# Patient Record
Sex: Male | Born: 1970 | Race: White | Hispanic: No | Marital: Married | Smoking: Former smoker
Health system: Southern US, Community
[De-identification: ages and names within clinical notes are randomized; demographics above are authoritative.]

## PROBLEM LIST (undated history)

## (undated) DIAGNOSIS — I2510 Atherosclerotic heart disease of native coronary artery without angina pectoris: Secondary | ICD-10-CM

## (undated) DIAGNOSIS — Z9889 Other specified postprocedural states: Secondary | ICD-10-CM

## (undated) DIAGNOSIS — M109 Gout, unspecified: Secondary | ICD-10-CM

## (undated) DIAGNOSIS — I10 Essential (primary) hypertension: Secondary | ICD-10-CM

## (undated) HISTORY — PX: SHOULDER SURGERY: SHX246

## (undated) HISTORY — DX: Gout, unspecified: M10.9

## (undated) HISTORY — PX: NECK MASS EXCISION: SHX2079

## (undated) HISTORY — DX: Essential (primary) hypertension: I10

## (undated) HISTORY — PX: SHOULDER ARTHROSCOPY: SHX128

## (undated) HISTORY — PX: APPENDECTOMY: SHX54

## (undated) HISTORY — DX: Other specified postprocedural states: Z98.890

## (undated) HISTORY — DX: Atherosclerotic heart disease of native coronary artery without angina pectoris: I25.10

## (undated) HISTORY — PX: MANDIBLE FRACTURE SURGERY: SHX706

---

## 2000-05-18 DIAGNOSIS — Z9889 Other specified postprocedural states: Secondary | ICD-10-CM

## 2000-05-18 HISTORY — PX: KNEE SURGERY: SHX244

## 2000-05-18 HISTORY — DX: Other specified postprocedural states: Z98.890

## 2005-03-08 ENCOUNTER — Emergency Department (HOSPITAL_COMMUNITY): Admission: EM | Admit: 2005-03-08 | Discharge: 2005-03-09 | Payer: Self-pay | Admitting: Emergency Medicine

## 2008-03-08 ENCOUNTER — Ambulatory Visit: Payer: Self-pay | Admitting: Sports Medicine

## 2008-03-08 DIAGNOSIS — M25569 Pain in unspecified knee: Secondary | ICD-10-CM

## 2010-02-20 ENCOUNTER — Encounter: Payer: Self-pay | Admitting: Internal Medicine

## 2010-02-20 ENCOUNTER — Ambulatory Visit: Payer: Self-pay

## 2010-02-20 DIAGNOSIS — R002 Palpitations: Secondary | ICD-10-CM | POA: Insufficient documentation

## 2010-06-17 NOTE — Miscellaneous (Signed)
Summary: Orders Update  Clinical Lists Changes  Problems: Added new problem of PALPITATIONS (ICD-785.1) Orders: Added new Referral order of Holter Monitor (Holter Monitor) - Signed

## 2010-06-17 NOTE — Procedures (Signed)
Summary: Summary Report  Summary Report   Imported By: Erle Crocker 04/03/2010 13:47:12  _____________________________________________________________________  External Attachment:    Type:   Image     Comment:   External Document

## 2010-11-26 ENCOUNTER — Encounter: Payer: Self-pay | Admitting: Internal Medicine

## 2011-01-05 ENCOUNTER — Encounter: Payer: Self-pay | Admitting: Family Medicine

## 2011-01-05 ENCOUNTER — Ambulatory Visit (INDEPENDENT_AMBULATORY_CARE_PROVIDER_SITE_OTHER): Payer: 59 | Admitting: Family Medicine

## 2011-01-05 VITALS — BP 148/83 | Ht 71.5 in | Wt 180.0 lb

## 2011-01-05 DIAGNOSIS — M25511 Pain in right shoulder: Secondary | ICD-10-CM

## 2011-01-05 DIAGNOSIS — M25519 Pain in unspecified shoulder: Secondary | ICD-10-CM

## 2011-01-05 MED ORDER — MELOXICAM 15 MG PO TABS: 15.0000 mg | ORAL_TABLET | Freq: Every day | ORAL | Status: DC

## 2011-01-05 NOTE — Progress Notes (Signed)
  Subjective:    Patient ID: Brendan Turner, male    DOB: [birth year], 40 y.o.   MRN: [id number]  HPI 40 yo M here for right shoulder pain.  Patient reports no known injury. Has been working out with P90x including doing shoulder DVD. Started noticing gradual pain lateral right shoulder worse with reaching, overhead activities, forward raises with weights. Sleeping on right side worsens also. Pain seems better toward end of day. Not waking him up at night. Not currently taking any pain medicines. Right handed.  Past Medical History  Diagnosis Date  . S/P arthroscopy of left knee 2002    No current outpatient prescriptions on file prior to visit.    Past Surgical History  Procedure Date  . Knee surgery 2002    left knee arthroscopy    No Known Allergies  History   Social History  . Marital Status: Single    Spouse Name: N/A    Number of Children: N/A  . Years of Education: N/A   Occupational History  . Not on file.   Social History Main Topics  . Smoking status: Never Smoker   . Smokeless tobacco: Not on file  . Alcohol Use: Not on file  . Drug Use: Not on file  . Sexually Active: Not on file   Other Topics Concern  . Not on file   Social History Narrative   Neighbors with vince howard and friends with Delene Ruffini who suggested he see me does cycling and some running with them.     No family history on file.  BP 148/83  Ht 5' 11.5" (1.816 m)  Wt 180 lb (81.647 kg)  BMI 24.75 kg/m2  Review of Systems See HPI above.    Objective:   Physical Exam Gen: NAD R shoulder: No swelling, ecchymoses.  No gross deformity. No TTP biceps tendon, AC. FROM with mild painful arc. Positive neers, empty can.  Negative hawkins. Negative Speeds, Yergasons. Negative crossover adduction. 5/5 strength with empty can and resisted internal/external rotation. Negative apprehension. NV intact distally.    Assessment & Plan:  1. Right shoulder rotator cuff  impingement - strength normal and no acute injury to suggest rotator cuff tear.  Start with mobic + home rotator cuff exercises, avoid painful activities.  If not improving as expected, consider PT, ultrasound, and/or subacromial injection.  Icing prn.  See instructions for further.

## 2011-01-05 NOTE — Assessment & Plan Note (Signed)
Right shoulder rotator cuff impingement - strength normal and no acute injury to suggest rotator cuff tear.  Start with mobic + home rotator cuff exercises, avoid painful activities.  If not improving as expected, consider PT, ultrasound, and/or subacromial injection.  Icing prn.  See instructions for further.

## 2011-01-05 NOTE — Patient Instructions (Signed)
You have rotator cuff impingement (subacromial bursitis, rotator cuff tendinitis). Try to avoid painful activities (overhead activities, lifting with extended arm) as much as possible. Meloxicam 15mg  daily with food for pain and inflammation x 1 week then as needed. Subacromial injection may be beneficial to help with pain and to decrease inflammation if you do not improve as expected. Home exercise program as discussed with theraband and scapular stabilization exercises - these are very important for long term relief even if an injection was given. If not improving at follow-up we will consider ultrasound and/or physical therapy. Follow up with me in 6 weeks or as needed.

## 2011-07-07 ENCOUNTER — Ambulatory Visit (INDEPENDENT_AMBULATORY_CARE_PROVIDER_SITE_OTHER): Payer: 59 | Admitting: Sports Medicine

## 2011-07-07 VITALS — BP 124/72

## 2011-07-07 DIAGNOSIS — M25569 Pain in unspecified knee: Secondary | ICD-10-CM

## 2011-07-07 DIAGNOSIS — M25519 Pain in unspecified shoulder: Secondary | ICD-10-CM

## 2011-07-07 DIAGNOSIS — M25511 Pain in right shoulder: Secondary | ICD-10-CM

## 2011-07-07 NOTE — Assessment & Plan Note (Signed)
He continues to have some left-sided knee pain ever since arthroscopy 10 years ago  We will try a compression patellar strap  He needs to work his abductors and VMO to get better balance for his quadriceps muscles  Recheck in 2 months

## 2011-07-07 NOTE — Progress Notes (Signed)
  Subjective:    Patient ID: Brendan Turner, male    DOB: Jul 17, 1970, 41 y.o.   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 Now still has periodic ant knee pain  Rt knee bottom half of knee cap gets burning with pressure on knee  RT shoulder pain after P90X workouts and saw Dr Pearletha Forge in fall of 2012 Treated for impingement Not better Wakes him at night if he rolls over/ reaching for seat belt Normally runs, bikes and very active past year    Review of Systems     Objective:   Physical Exam  Rt shoulder exam: No pain with abduction and elevation Full ROM and no scapular asynchrony Good IR and ER strength at 90 degrees Good abduction strength at 20 deg Empty can positive Hawkins positive Biceps testing neg Obrein's neg Strong push off   Rt knee exam: Full flexion and extension Neg Mcmurray's Facet impingement on rt lat patellar groove with light patellar pressure  Lt knee exam: Full flexion and extension Crepitation on lateral side of patella  MS K. Ultrasound There is some calcification near the insertion of the supraspinatous tendon on the right but the tendon itself is normal Subscapularis infraspinatus and teres minor tendons all normal Bicipital tendon is normal A.c. joint on the right has increased fluid compared to the left and a slightly displaced Dynamic motion shows impingement of the greater tuberosity under the acromion  Scanning of the retropatellar groove on the right shows little irregularity and spurring On the left there is an area of scar tissue in 1 significant spur noted        Assessment & Plan:

## 2011-07-07 NOTE — Patient Instructions (Signed)
For knees - Biking is excellent Controlled quad extensions squeezing a ball between knees Wall sits doing same Try compression strap on left Ice left after running  Ignore RT knee burning  Rt shoulder  Use 5-10 lbs lawnmower, robbery, and up right row  Try pull ups with palm of hands facing you  Please follow up in 2 months  Thank you for seeing [place] today!

## 2011-07-07 NOTE — Assessment & Plan Note (Signed)
He was seen by Dr. Pearletha Forge and felt to have impingement syndrome and I agree with this diagnosis  There is not a lot of rotator cuff inflammation or any signs of a tear  We workouts and scapular stabilization and upper back exercises to balance shoulder position and lessen the impingement  Recheck in 2 months

## 2011-09-29 ENCOUNTER — Ambulatory Visit (INDEPENDENT_AMBULATORY_CARE_PROVIDER_SITE_OTHER): Payer: 59 | Admitting: Sports Medicine

## 2011-09-29 DIAGNOSIS — M25519 Pain in unspecified shoulder: Secondary | ICD-10-CM

## 2011-09-29 DIAGNOSIS — M25511 Pain in right shoulder: Secondary | ICD-10-CM

## 2011-09-29 NOTE — Assessment & Plan Note (Addendum)
Suprascapular pain largely resolved. A.c. joint is swollen and sprained. Injected today. Asked patient to use Voltaren gel in this area. He is to avoid using it for 2 days and then to use it lightly. He will return in 3 weeks for followup and re ultrasound  Consent obtained and verified. Sterile prep. Furthur cleansed with alcohol. Topical analgesic spray: Ethyl chloride. Joint: RT AC Approached in typical fashion with: [place] identified the AC swelling and from anterior approach we directed the needle into the joint space.  Good flow noted filling the joint.  Completed without difficulty Meds: 1/2 cc kenalog 10 and 1/2 cc lidocaine 1% Needle: TB syringe w 32 G Aftercare instructions and Red flags advised.
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
## 2011-10-22 ENCOUNTER — Ambulatory Visit (INDEPENDENT_AMBULATORY_CARE_PROVIDER_SITE_OTHER): Payer: 59 | Admitting: Sports Medicine

## 2011-10-22 VITALS — BP 110/64

## 2011-10-22 DIAGNOSIS — M19019 Primary osteoarthritis, unspecified shoulder: Secondary | ICD-10-CM

## 2011-10-22 DIAGNOSIS — M19011 Primary osteoarthritis, right shoulder: Secondary | ICD-10-CM

## 2011-10-22 NOTE — Progress Notes (Signed)
  Subjective:    Patient ID: Brendan Turner, male    DOB: 19-Sep-1970, 41 y.o.   MRN: [id number]  HPI [name] is a pleasant 41 year old male patient, today to follow up on his right shoulder a.c. Joint status post cortisone injection 3 weeks ago. He is doing much better. He stated he 80-90% better, the pain on his right shoulder has improved tremendously. He is able to sleep without any pain. He denies any numbness or tingling but does state that the function of the shoulder is excellent. He describes mild discomfort with certain positions.  Patient Active Problem List  Diagnoses  . KNEE PAIN, LEFT, CHRONIC  . PALPITATIONS  . Right shoulder pain   No current outpatient prescriptions on file prior to visit.   No Known Allergies    Review of Systems  Constitutional: Negative for fever, chills, diaphoresis and fatigue.  Musculoskeletal: Negative for back pain, joint swelling, arthralgias and gait problem.  Neurological: Negative for weakness and numbness.       Objective:   Physical Exam  Constitutional: He is oriented to person, place, and time. He appears well-developed and well-nourished.       BP 110/64   Pulmonary/Chest: Effort normal.  Musculoskeletal:       Right shoulder with intact skin. No swelling no hematomas. Full range of motion for flexion extension internal, external rotation abduction and adduction. Hawkins test negative for tear cuff impingement . A.c. Joint with no tenderness to palpation with mild positive crossover test, mild positive obriens . Empty can test negative for rotator cuff tear. Speed test negative for biceps tendinoplasty No tenderness at the bicipital groove. Strength 5 over 5 with internal and external rotation . Sensation is intact .  Neurological: He is alert and oriented to person, place, and time.  Skin: Skin is warm. No rash noted. No erythema.  Psychiatric: He has a normal mood and affect. His behavior is normal.      MSK U/S :  Right ACJ with less swelling and 0.69cm    Assessment & Plan:   1. Arthritis of right acromioclavicular joint    Resume physical activity. Biking.  No heavy weight lifting, bench press. F/U PRN

## 2012-02-08 ENCOUNTER — Ambulatory Visit (INDEPENDENT_AMBULATORY_CARE_PROVIDER_SITE_OTHER): Payer: 59 | Admitting: Family Medicine

## 2012-02-08 VITALS — BP 130/78 | Ht 71.0 in | Wt 185.0 lb

## 2012-02-08 DIAGNOSIS — M19019 Primary osteoarthritis, unspecified shoulder: Secondary | ICD-10-CM

## 2012-02-08 DIAGNOSIS — M259 Joint disorder, unspecified: Secondary | ICD-10-CM

## 2012-02-09 ENCOUNTER — Encounter: Payer: Self-pay | Admitting: Family Medicine

## 2012-02-09 DIAGNOSIS — M19019 Primary osteoarthritis, unspecified shoulder: Secondary | ICD-10-CM | POA: Insufficient documentation

## 2012-02-09 NOTE — Progress Notes (Signed)
  Subjective:    Patient ID: Brendan Turner, male    DOB: 12/23/1970, 41 y.o.   MRN: [id number]  HPI  Right a.c. joint pain. About 6 weeks ago he had a steroid injection and that really improve his pain about 90% until the last 2 weeks. He is getting read to go on a trip out of town and does not want to be hampered in his activities so he would like to pursue another corticosteroid injection. He's a little frustrated that his symptoms have come back so quickly. During the summer he does a lot of aerobic exercise for fitness but during the winter he typically likes to add some weight training. He does not feel like he can do that currently because weight training of any type seems to aggravate this.  Review of Systems He has had no numbness in his hand, no fevers, sweats, chills. He's noted no erythema of the a.c. joint on the right. He is otherwise felt well.    Objective:   Physical Exam Vital signs are reviewed. GENERAL: Well-developed male no acute distress SHOULDER: Right. Mildly positive impingement signs. Rotator cuff strength is intact in all planes the rotator cuff. Distally he is neurovascularly intact. A.c. joint: Right. Deformity noted. This area is about 1-1-1/2 cm in diameter and in height. This compares with a fairly normal appearing a.c. joint on the left. There is no erythema noted there. He is tender to palpation along the a.c. joint. The clavicle is normal. SCAPULAR motion, symmetrical.  ; The right a.c. joint reveals significant synovitis. There is no real fluid under the synovial. There is an enlarging central calcium deposit that appears larger than his last ultrasound.  INJECTION: Patient was given informed consent, signed copy in the chart. Appropriate time out was taken. Area prepped and draped in usual sterile fashion. One cc of methylprednisolone 40 mg/ml plus  one cc of 1% lidocaine without epinephrine was injected into the right a.c. joint using a(n) ultrasound-guided  and perpendicular approach. The patient tolerated the procedure well. There were no complications. Post procedure instructions were given.        Assessment & Plan:

## 2012-02-09 NOTE — Assessment & Plan Note (Signed)
.   A corticosteroid injection today. Given the short length of time since his last one and there is significant amount of synovitis I see, I doubt this is going to be a long-term solution for him.US guided Tenotomy might be potentially useful for him. Also discussed with him other surgical interventions. He'll let us know if he wants to pursue either of these.

## 2012-02-24 ENCOUNTER — Telehealth: Payer: Self-pay | Admitting: Family Medicine

## 2012-02-24 NOTE — Telephone Encounter (Signed)
Amy I guess the injection we tried last did not help much. We sort of expected this---he has BAD AC joint arthropathy. I would send him to Dr Luiz Blare unless he has a particular ortho person he wants.  I would send the last 3 notes. THANKS! Denny Levy

## 2012-02-24 NOTE — Telephone Encounter (Signed)
Message copied by Nestor Ramp on Wed Feb 24, 2012  2:23 PM ------      Message from: [place], [place] C      Created: Tue Feb 23, 2012  4:42 PM      Regarding: FW: phone message      Contact: [phone]                   ----- Message -----         From: Lizbeth Bark         Sent: 02/23/2012   4:05 PM           To: Mora Bellman, RN      Subject: phone message                                            Would like a referral for a  for rt shoulder ac joint surgery.

## 2012-02-25 NOTE — Telephone Encounter (Signed)
Spoke with pt- he will be out of town next week, and will need to reschedule appt.  He wants to check around and make sure he wants to see Dr. Luiz Blare.  He will let me know when he is ready to reschedule the appointment.

## 2012-02-25 NOTE — Telephone Encounter (Signed)
Scheduled pt for appt with Dr. Luiz Blare for 03/01/12 at 445.  Called pt to notify of appt, left VM to return my call.

## 2013-05-17 ENCOUNTER — Other Ambulatory Visit: Payer: Self-pay

## 2013-05-17 ENCOUNTER — Telehealth: Payer: Self-pay | Admitting: Gastroenterology

## 2013-05-17 DIAGNOSIS — R197 Diarrhea, unspecified: Secondary | ICD-10-CM

## 2013-05-17 NOTE — Telephone Encounter (Signed)
Joe called me last night.  He has had acute watery, non-bloody diarrhea for about 6 days.  Daughters were sick with URI recently as well.  He has no abd pain but is feeling a bit lethargic, weakened.  At outset of this, he vomited twice.  No fevers, no recent abx.  Likely infectious etiology. He is coming in today for stool testing (c. Diff, o and p, routine culture, fecal leukocytes).  He is going to start 1-2 imodium daily as well.  He is going to call me in 24-48 hours to update on his symptoms.

## 2013-08-30 ENCOUNTER — Ambulatory Visit (INDEPENDENT_AMBULATORY_CARE_PROVIDER_SITE_OTHER): Payer: PRIVATE HEALTH INSURANCE | Admitting: Podiatry

## 2013-08-30 ENCOUNTER — Ambulatory Visit (INDEPENDENT_AMBULATORY_CARE_PROVIDER_SITE_OTHER): Payer: PRIVATE HEALTH INSURANCE

## 2013-08-30 ENCOUNTER — Encounter: Payer: Self-pay | Admitting: Podiatry

## 2013-08-30 VITALS — BP 136/84 | HR 50 | Resp 14 | Ht 71.0 in | Wt 185.0 lb

## 2013-08-30 DIAGNOSIS — M779 Enthesopathy, unspecified: Secondary | ICD-10-CM

## 2013-08-30 DIAGNOSIS — S92403A Displaced unspecified fracture of unspecified great toe, initial encounter for closed fracture: Secondary | ICD-10-CM

## 2013-08-30 DIAGNOSIS — S92919A Unspecified fracture of unspecified toe(s), initial encounter for closed fracture: Secondary | ICD-10-CM

## 2013-08-30 LAB — SEDIMENTATION RATE: SED RATE: 4 mm/h (ref 0–16)

## 2013-08-30 LAB — URIC ACID: Uric Acid, Serum: 7.1 mg/dL (ref 4.0–7.8)

## 2013-08-30 LAB — C-REACTIVE PROTEIN: CRP: <0.5 mg/dL (ref <0.60)

## 2013-08-30 LAB — RHEUMATOID FACTOR: Rhuematoid fact SerPl-aCnc: <10 IU/mL (ref <=14)

## 2013-08-30 NOTE — Patient Instructions (Signed)
Have lab test done today if possible

## 2013-08-30 NOTE — Progress Notes (Signed)
   Subjective:    Patient ID: Brendan Turner, male    DOB: Mar 17, 1971, 43 y.o.   MRN: [id number]  HPI Comments: N toe pain L right hallux  D 08/25/2013, worsening after spin-class yesterday O after beach trip spent in flip-flops C enlarged, red and painful, and decrease motion A walking, especially pushing off T none   Toe Pain    patient relates a history of fracture in the right great toe some years ago. This patient is complaining of reoccurring right hallux interphalangeal joint pain that occurs and resolves spontaneously with rest and reduction of activity over at least a years time. This current episode was triggered after a spin class and is more painful and swollen then previous episodes.   Patient has a business trip scheduled today and is leaving for [place].    Review of Systems  All other systems reviewed and are negative.      Objective:   Physical Exam  Orientated x3 white male  Vascular: DP and PT pulses 2/4 bilaterally  Neurological: Ankle reflexes reactive bilaterally  Dermatological: Low-grade edema and erythema noted in the right hallux without any warmth.  Musculoskeletal: No deformities noted, bilaterally Dorsi and plantarflexion of the right hallux 3-4/5 right and 5/5 left. There is palpable tenderness in the right hallux interphalangeal joint and metatarsophalangeal joint right. There is no restriction in range of motion of the right hallux MPJ, or right hallux interphalangeal joint.   X-ray exam right foot  Intact bony structure without a fracture and/or dislocation noted. An accessory sesamoid bone is noted sub-interphalangeal joint on the hallux. The joint spaces including the hallux interphalangeal joint, are unremarkable A mild metatarsus adductus foot type is noted. Adductovarus rotation noted in the fourth and fifth digits.  Radiographic impression: No acute bony abnormality noted in the right foot       Assessment & Plan:    Assessment: Capsulitis/tendinitis right first MPJ Rule out inflammatory disease  Plan: Patient is referred to lab for: Uric acid Sedimentation rate Rheumatoid factor ANA titer.  Advised patient to take over-the-counter NSAIDs for pain and inflammation.  Reappoint x7 days

## 2013-08-31 ENCOUNTER — Encounter: Payer: Self-pay | Admitting: Podiatry

## 2013-08-31 LAB — ANA: Anti Nuclear Antibody(ANA): NEGATIVE

## 2013-09-04 ENCOUNTER — Telehealth: Payer: Self-pay | Admitting: *Deleted

## 2013-09-04 ENCOUNTER — Ambulatory Visit (INDEPENDENT_AMBULATORY_CARE_PROVIDER_SITE_OTHER): Payer: PRIVATE HEALTH INSURANCE | Admitting: Podiatry

## 2013-09-04 ENCOUNTER — Encounter: Payer: Self-pay | Admitting: Podiatry

## 2013-09-04 VITALS — BP 131/82 | HR 64 | Resp 18

## 2013-09-04 DIAGNOSIS — M779 Enthesopathy, unspecified: Secondary | ICD-10-CM

## 2013-09-04 MED ORDER — PREDNISONE 10 MG PO KIT: PACK | ORAL | Status: DC

## 2013-09-04 NOTE — Progress Notes (Signed)
Patient ID: Brendan Turner, male   DOB: 1970-10-20, 43 y.o.   MRN: [id number]  Subjective: This patient presents for followup visit for a painful right hallux. The symptoms have worsened since last week. He is continue to be quite active in traveling to [place] for business and was standing and walking continuously. He relates a history of participating in sports, running with flip-flops leading up to the pain and discomfort in the right hallux area. He is also had recurrent episodes of pain in this area without any obvious direct injury.  Objective: Orientated x34 space 43 year old white male  The right hallux is edematous with low-grade erythema localized to the interphalangeal joint. He is able to dorsi and plantarflex the right hallux 3-4/5 right and 5/5 left. The right hallux interphalangeal joint is tender to palpation.  Arthritis screening panel dated 08/30/2013 including: C-reactive protein Rheumatoid factor Sedimentation rate Uric acid All within normal limits.  Assessment: Cannot rule out extensor/flexor tendinopathy Cannot completely rule out inflammatory origin Continuous standing/ walking exacerbating symptoms  Plan: Patient is given hard copy of arthritis panel. A Darco shoe was dispensed to wear in the right foot A 10 mg six-day prednisone Dosepak dispensed Add on Tylenol for breakthrough pain  Patient return his symptoms are not improving in the next 2-3 weeks

## 2013-09-04 NOTE — Patient Instructions (Signed)
Wear the surgical shoe on the right foot daily until pain and swelling stops. Okay today and on Tylenol when taking prednisone. Return to symptoms do not improve in the next several weeks.

## 2013-09-04 NOTE — Telephone Encounter (Signed)
I called the patient and informed him I just read Dr. Phoebe Perch chart note and he wanted him back in 7 days.  He was transferred to a scheduler.

## 2013-09-04 NOTE — Telephone Encounter (Addendum)
I need the results of my blood test.  If it's not Rheumatoid what does he suggest I do next?  It's not getting any better.  I informed him Dr. Amalia Hailey has not reviewed the results.  Once he does we'll give him a call back.

## 2013-09-11 ENCOUNTER — Ambulatory Visit (INDEPENDENT_AMBULATORY_CARE_PROVIDER_SITE_OTHER): Payer: PRIVATE HEALTH INSURANCE

## 2013-09-11 ENCOUNTER — Ambulatory Visit: Payer: PRIVATE HEALTH INSURANCE | Admitting: Podiatry

## 2013-09-11 ENCOUNTER — Encounter: Payer: Self-pay | Admitting: Podiatry

## 2013-09-11 VITALS — BP 135/77 | HR 68 | Resp 15 | Ht 71.0 in | Wt 185.0 lb

## 2013-09-11 DIAGNOSIS — M779 Enthesopathy, unspecified: Secondary | ICD-10-CM

## 2013-09-11 DIAGNOSIS — M79609 Pain in unspecified limb: Secondary | ICD-10-CM

## 2013-09-11 MED ORDER — CEPHALEXIN 500 MG PO CAPS: 500.0000 mg | ORAL_CAPSULE | Freq: Four times a day (QID) | ORAL | Status: DC

## 2013-09-11 MED ORDER — COLCHICINE 0.6 MG PO TABS: 0.6000 mg | ORAL_TABLET | Freq: Every day | ORAL | Status: DC

## 2013-09-11 NOTE — Patient Instructions (Signed)
Want to refer you to a rheumatologist for further evaluation.

## 2013-09-12 NOTE — Progress Notes (Signed)
Patient ID: Brendan Turner, male   DOB: 1971/02/05, 43 y.o.   MRN: [id number]  Subjective: This patient presents for followup care for ongoing painful swollen right hallux area. Initially presented on 08/30/2013. He has a history of recurrent pain in this area, however, this episode his been more persistent. At the initial presentation he described this episode developing after a spin class and walking on the beach in flip-flops.  He was referred to lab for a arthritis panel which was within normal limits including C-reactive protein, rheumatoid factor, sedimentation rate, uric acid and ANA.  He returned on April 20 still complaining of pain in the right hallux area. At that time a Darco shoe was dispensed and 10 mg six-day tapering prednisone dose pack prescribed. He completed the prednisone and walk in the surgical shoe and right hallux remains painful, swollen.  Patient is a Programmer, applications person and travels continuously, with minimal time for rest.  Objective: The right hallux remains edematous with erythema at localized to the hallux interphalangeal joint. Dorsi and plantarflexion of the right hallux is 3/5.  X-ray examination right foot   Intact bony structure without fracture or dislocation noted. Increased soft tissue density noted at the medial interphalangeal joint of the hallux.  Radiographic impression: no acute bony abnormality noted  Assessment: Rule out inflammatory disease Possible cellulitis right hallux Possible flexor/extensor tendinopathy  Plan: At this time because study symptoms are persistent and have not responded to immobilization with surgical shoe and prednisone dose pack would like to have a rheumatologist evaluate for possible inflammatory disease.  Symptomatically today I prescribed Cephalexin 500 mg by mouth 4 times a day x7 days Colchicine 0.6 mg by mouth one daily, dispensed #30  Contact Dr. Ouida Sills for rheumatology consult.

## 2013-10-04 ENCOUNTER — Telehealth: Payer: Self-pay | Admitting: *Deleted

## 2013-10-04 NOTE — Telephone Encounter (Signed)
I called to see if the patient had been scheduled an appointment with Dr. Tobie Lords.  He is scheduled for 10/12/2013 at 8:30am.

## 2017-07-29 ENCOUNTER — Other Ambulatory Visit: Payer: Self-pay | Admitting: Family Medicine

## 2017-07-29 DIAGNOSIS — J0191 Acute recurrent sinusitis, unspecified: Secondary | ICD-10-CM

## 2018-07-08 DIAGNOSIS — Z6825 Body mass index (BMI) 25.0-25.9, adult: Secondary | ICD-10-CM | POA: Diagnosis not present

## 2018-07-08 DIAGNOSIS — J111 Influenza due to unidentified influenza virus with other respiratory manifestations: Secondary | ICD-10-CM | POA: Diagnosis not present

## 2018-09-20 DIAGNOSIS — M109 Gout, unspecified: Secondary | ICD-10-CM | POA: Diagnosis not present

## 2018-09-20 DIAGNOSIS — R82998 Other abnormal findings in urine: Secondary | ICD-10-CM | POA: Diagnosis not present

## 2018-09-20 DIAGNOSIS — Z125 Encounter for screening for malignant neoplasm of prostate: Secondary | ICD-10-CM | POA: Diagnosis not present

## 2018-09-20 DIAGNOSIS — R7989 Other specified abnormal findings of blood chemistry: Secondary | ICD-10-CM | POA: Diagnosis not present

## 2018-09-20 DIAGNOSIS — Z0000 Encounter for general adult medical examination without abnormal findings: Secondary | ICD-10-CM | POA: Diagnosis not present

## 2018-09-27 DIAGNOSIS — Z0000 Encounter for general adult medical examination without abnormal findings: Secondary | ICD-10-CM | POA: Diagnosis not present

## 2018-09-27 DIAGNOSIS — Z1331 Encounter for screening for depression: Secondary | ICD-10-CM | POA: Diagnosis not present

## 2018-10-17 DIAGNOSIS — D2372 Other benign neoplasm of skin of left lower limb, including hip: Secondary | ICD-10-CM | POA: Diagnosis not present

## 2018-10-17 DIAGNOSIS — L821 Other seborrheic keratosis: Secondary | ICD-10-CM | POA: Diagnosis not present

## 2018-12-30 DIAGNOSIS — Z20828 Contact with and (suspected) exposure to other viral communicable diseases: Secondary | ICD-10-CM | POA: Diagnosis not present

## 2018-12-30 DIAGNOSIS — Z6824 Body mass index (BMI) 24.0-24.9, adult: Secondary | ICD-10-CM | POA: Diagnosis not present

## 2019-01-05 ENCOUNTER — Other Ambulatory Visit: Payer: Self-pay

## 2019-01-05 ENCOUNTER — Ambulatory Visit (AMBULATORY_SURGERY_CENTER): Payer: Self-pay | Admitting: *Deleted

## 2019-01-05 VITALS — Temp 97.5°F | Ht 71.0 in | Wt 181.0 lb

## 2019-01-05 DIAGNOSIS — Z1211 Encounter for screening for malignant neoplasm of colon: Secondary | ICD-10-CM

## 2019-01-05 MED ORDER — PEG 3350-KCL-NA BICARB-NACL 420 G PO SOLR: 4000.0000 mL | Freq: Once | ORAL | 0 refills | Status: AC

## 2019-01-05 NOTE — Progress Notes (Signed)
No egg or soy allergy known to patient  No issues with past sedation with any surgeries  or procedures, no intubation problems  No diet pills per patient No home 02 use per patient  No blood thinners per patient  Pt denies issues with constipation  No A fib or A flutter  EMMI video sent to pt's e mail  

## 2019-01-13 ENCOUNTER — Telehealth: Payer: Self-pay | Admitting: Gastroenterology

## 2019-01-13 NOTE — Telephone Encounter (Signed)
Covid-19 screening questions °  °  °Do you now or have you had a fever in the last 14 days?  NO °  °Do you have any respiratory symptoms of shortness of breath or cough now or in the last 14 days?  NO °  °Do you have any family members or close contacts with diagnosed or suspected Covid-19 in the past 14 days? NO °  °Have you been tested for Covid-19 and found to be positive? NO ° °Advised to check in on the 4th floor and that care partner may wait in the car or come up to the lobby during procedure. Also made aware that both must enter the building wearing a mask. °

## 2019-01-16 ENCOUNTER — Ambulatory Visit (AMBULATORY_SURGERY_CENTER): Payer: BLUE CROSS/BLUE SHIELD | Admitting: Gastroenterology

## 2019-01-16 ENCOUNTER — Encounter: Payer: Self-pay | Admitting: Gastroenterology

## 2019-01-16 ENCOUNTER — Other Ambulatory Visit: Payer: Self-pay

## 2019-01-16 VITALS — BP 129/67 | HR 58 | Temp 98.6°F | Resp 12 | Ht 71.0 in | Wt 181.0 lb

## 2019-01-16 DIAGNOSIS — Z1211 Encounter for screening for malignant neoplasm of colon: Secondary | ICD-10-CM

## 2019-01-16 DIAGNOSIS — K635 Polyp of colon: Secondary | ICD-10-CM | POA: Diagnosis not present

## 2019-01-16 DIAGNOSIS — D123 Benign neoplasm of transverse colon: Secondary | ICD-10-CM

## 2019-01-16 MED ORDER — SODIUM CHLORIDE 0.9 % IV SOLN: 500.0000 mL | Freq: Once | INTRAVENOUS | Status: DC

## 2019-01-16 NOTE — Op Note (Addendum)
[facility] Patient Name: Brendan Turner Procedure Date: 01/16/2019 10:09 AM MRN: [id number] Endoscopist: Milus Banister , MD Age: 48 Referring MD:  Date of Birth: 04/01/1971 Gender: Male Account #: [id number] Procedure:                Colonoscopy Indications:              Screening for colorectal malignant neoplasm Medicines:                No sedation. Procedure:                Pre-Anesthesia Assessment:                           - Prior to the procedure, a History and Physical                            was performed, and patient medications and                            allergies were reviewed. The patient's tolerance of                            previous anesthesia was also reviewed. The risks                            and benefits of the procedure and the sedation                            options and risks were discussed with the patient.                            All questions were answered, and informed consent                            was obtained. Prior Anticoagulants: The patient has                            taken no previous anticoagulant or antiplatelet                            agents. ASA Grade Assessment: I - A normal, healthy                            patient. After reviewing the risks and benefits,                            the patient was deemed in satisfactory condition to                            undergo the procedure.                           After obtaining informed consent, the colonoscope  was passed under direct vision. Throughout the                            procedure, the patient's blood pressure, pulse, and                            oxygen saturations were monitored continuously. The                            Colonoscope was introduced through the anus and                            advanced to the the TI, identified by appendiceal                            orifice and ileocecal valve. The  colonoscopy was                            performed without difficulty. The patient tolerated                            the procedure well. The quality of the bowel                            preparation was excellent. The ileocecal valve,                            appendiceal orifice, and rectum were photographed. Scope In: 10:12:08 AM Scope Out: 10:27:36 AM Scope Withdrawal Time: 0 hours 10 minutes 43 seconds  Total Procedure Duration: 0 hours 15 minutes 28 seconds  Findings:                 Normal terminal ileum.                           A 4 mm polyp was found in the transverse colon. The                            polyp was sessile. The polyp was removed with a                            cold snare. Resection and retrieval were complete.                           The exam was otherwise without abnormality on                            direct and retroflexion views. Complications:            No immediate complications. Estimated blood loss:                            None. Estimated Blood Loss:     Estimated blood loss: none. Impression:               -  One 4 mm polyp in the transverse colon, removed                            with a cold snare. Resected and retrieved.                           - Normal terminal ileum.                           - The examination was otherwise normal. Recommendation:           - Patient has a contact number available for                            emergencies. The signs and symptoms of potential                            delayed complications were discussed with the                            patient. Return to normal activities tomorrow.                            Written discharge instructions were provided to the                            patient.                           - Resume previous diet.                           - Continue present medications.                           - Repeat colonoscopy is recommended. The                             colonoscopy date will be determined after pathology                            results from today's exam become available for                            review. Milus Banister, MD 01/16/2019 10:30:26 AM This report has been signed electronically.

## 2019-01-16 NOTE — Progress Notes (Signed)
PT taken to PACU. Monitors in place. VSS. Report given to RN. 

## 2019-01-16 NOTE — Patient Instructions (Signed)
Read all handouts given to y ou by  Your recovery room nurse.  Thank-you for choosing [place] for your medical needs today.  YOU HAD AN ENDOSCOPIC PROCEDURE TODAY AT [facility]:   Refer to the procedure report that was given to you for any specific questions about what was found during the examination.  If the procedure report does not answer your questions, please call your gastroenterologist to clarify.  If you requested that your care partner not be given the details of your procedure findings, then the procedure report has been included in a sealed envelope for you to review at your convenience later.  YOU SHOULD EXPECT: Some feelings of bloating in the abdomen. Passage of more gas than usual.  Walking can help get rid of the air that was put into your GI tract during the procedure and reduce the bloating. If you had a lower endoscopy (such as a colonoscopy or flexible sigmoidoscopy) you may notice spotting of blood in your stool or on the toilet paper. If you underwent a bowel prep for your procedure, you may not have a normal bowel movement for a few days.  Please Note:  You might notice some irritation and congestion in your nose or some drainage.  This is from the oxygen used during your procedure.  There is no need for concern and it should clear up in a day or so.  SYMPTOMS TO REPORT IMMEDIATELY:   Following lower endoscopy (colonoscopy or flexible sigmoidoscopy):  Excessive amounts of blood in the stool  Significant tenderness or worsening of abdominal pains  Swelling of the abdomen that is new, acute  Fever of 100F or higher   For urgent or emergent issues, a gastroenterologist can be reached at any hour by calling [phone].   DIET:  We do recommend a small meal at first, but then you may proceed to your regular diet.  Drink plenty of fluids but you should avoid alcoholic beverages for 24 hours.  ACTIVITY:  You should plan to take it easy for the rest of today  and you should NOT DRIVE or use heavy machinery until tomorrow (because of the sedation medicines used during the test).    FOLLOW UP: Our staff will call the number listed on your records 48-72 hours following your procedure to check on you and address any questions or concerns that you may have regarding the information given to you following your procedure. If we do not reach you, we will leave a message.  We will attempt to reach you two times.  During this call, we will ask if you have developed any symptoms of COVID 19. If you develop any symptoms (ie: fever, flu-like symptoms, shortness of breath, cough etc.) before then, please call [phone].  If you test positive for Covid 19 in the 2 weeks post procedure, please call and report this information to [place].    If any biopsies were taken you will be contacted by phone or by letter within the next 1-3 weeks.  Please call us at [phone] if you have not heard about the biopsies in 3 weeks.    SIGNATURES/CONFIDENTIALITY: You and/or your care partner have signed paperwork which will be entered into your electronic medical record.  These signatures attest to the fact that that the information above on your After Visit Summary has been reviewed and is understood.  Full responsibility of the confidentiality of this discharge information lies with you and/or your care-partner.

## 2019-01-16 NOTE — Progress Notes (Signed)
Called to room to assist during endoscopic procedure.  Patient ID and intended procedure confirmed with present staff. Received instructions for my participation in the procedure from the performing physician.  

## 2019-01-16 NOTE — Progress Notes (Signed)
Pt's states no medical or surgical changes since previsit or office visit.  Temp taken by JB VS taken by [facility]

## 2019-01-18 ENCOUNTER — Encounter: Payer: Self-pay | Admitting: Gastroenterology

## 2019-01-18 ENCOUNTER — Telehealth: Payer: Self-pay

## 2019-01-18 NOTE — Telephone Encounter (Signed)
  Follow up Call-  Call back number 01/16/2019  Post procedure Call Back phone  # [phone]  Permission to leave phone message Yes  Some recent data might be hidden     Patient questions:  Do you have a fever, pain , or abdominal swelling? No. Pain Score  0 *  Have you tolerated food without any problems? Yes.    Have you been able to return to your normal activities? Yes.    Do you have any questions about your discharge instructions: Diet   No. Medications  No. Follow up visit  No.  Do you have questions or concerns about your Care? No.  Actions: * If pain score is 4 or above: No action needed, pain <4.  1. Have you developed a fever since your procedure? No  2.   Have you had an respiratory symptoms (SOB or cough) since your procedure? No  3.   Have you tested positive for COVID 19 since your procedure No  4.   Have you had any family members/close contacts diagnosed with the COVID 19 since your procedure?  No  If yes to any of these questions please route to Joylene John, RN and Alphonsa Gin, RN.

## 2019-03-14 DIAGNOSIS — H40023 Open angle with borderline findings, high risk, bilateral: Secondary | ICD-10-CM | POA: Diagnosis not present

## 2019-04-08 DIAGNOSIS — Z20828 Contact with and (suspected) exposure to other viral communicable diseases: Secondary | ICD-10-CM | POA: Diagnosis not present

## 2019-05-22 DIAGNOSIS — M25512 Pain in left shoulder: Secondary | ICD-10-CM | POA: Insufficient documentation

## 2019-05-23 DIAGNOSIS — M545 Low back pain: Secondary | ICD-10-CM | POA: Diagnosis not present

## 2019-05-23 DIAGNOSIS — M25512 Pain in left shoulder: Secondary | ICD-10-CM | POA: Diagnosis not present

## 2019-05-31 DIAGNOSIS — M67912 Unspecified disorder of synovium and tendon, left shoulder: Secondary | ICD-10-CM | POA: Diagnosis not present

## 2019-06-06 DIAGNOSIS — S43432D Superior glenoid labrum lesion of left shoulder, subsequent encounter: Secondary | ICD-10-CM | POA: Diagnosis not present

## 2019-06-06 DIAGNOSIS — M25512 Pain in left shoulder: Secondary | ICD-10-CM | POA: Diagnosis not present

## 2019-06-09 DIAGNOSIS — H40023 Open angle with borderline findings, high risk, bilateral: Secondary | ICD-10-CM | POA: Diagnosis not present

## 2019-07-26 DIAGNOSIS — Z20822 Contact with and (suspected) exposure to covid-19: Secondary | ICD-10-CM | POA: Diagnosis not present

## 2019-07-26 DIAGNOSIS — Z20828 Contact with and (suspected) exposure to other viral communicable diseases: Secondary | ICD-10-CM | POA: Diagnosis not present

## 2019-09-25 DIAGNOSIS — Z125 Encounter for screening for malignant neoplasm of prostate: Secondary | ICD-10-CM | POA: Diagnosis not present

## 2019-09-25 DIAGNOSIS — M109 Gout, unspecified: Secondary | ICD-10-CM | POA: Diagnosis not present

## 2019-09-25 DIAGNOSIS — Z0000 Encounter for general adult medical examination without abnormal findings: Secondary | ICD-10-CM | POA: Diagnosis not present

## 2019-09-25 DIAGNOSIS — R82998 Other abnormal findings in urine: Secondary | ICD-10-CM | POA: Diagnosis not present

## 2019-10-02 ENCOUNTER — Other Ambulatory Visit: Payer: Self-pay | Admitting: Internal Medicine

## 2019-10-02 DIAGNOSIS — E7849 Other hyperlipidemia: Secondary | ICD-10-CM | POA: Diagnosis not present

## 2019-10-02 DIAGNOSIS — Z0000 Encounter for general adult medical examination without abnormal findings: Secondary | ICD-10-CM | POA: Diagnosis not present

## 2019-10-02 DIAGNOSIS — Z1331 Encounter for screening for depression: Secondary | ICD-10-CM | POA: Diagnosis not present

## 2019-10-02 DIAGNOSIS — E785 Hyperlipidemia, unspecified: Secondary | ICD-10-CM

## 2019-11-06 ENCOUNTER — Other Ambulatory Visit: Payer: BLUE CROSS/BLUE SHIELD

## 2019-11-23 ENCOUNTER — Other Ambulatory Visit: Payer: BC Managed Care – PPO

## 2019-12-07 ENCOUNTER — Ambulatory Visit
Admission: RE | Admit: 2019-12-07 | Discharge: 2019-12-07 | Disposition: A | Discharge status: Home / Self Care | Payer: No Typology Code available for payment source | Source: Ambulatory Visit | Attending: Internal Medicine | Admitting: Internal Medicine

## 2019-12-07 DIAGNOSIS — E785 Hyperlipidemia, unspecified: Secondary | ICD-10-CM

## 2019-12-07 DIAGNOSIS — E7800 Pure hypercholesterolemia, unspecified: Secondary | ICD-10-CM | POA: Diagnosis not present

## 2019-12-13 ENCOUNTER — Ambulatory Visit (HOSPITAL_COMMUNITY)
Admission: RE | Admit: 2019-12-13 | Discharge: 2019-12-13 | Disposition: A | Discharge status: Home / Self Care | Payer: BC Managed Care – PPO | Source: Ambulatory Visit | Attending: Internal Medicine | Admitting: Internal Medicine

## 2019-12-13 ENCOUNTER — Other Ambulatory Visit: Payer: Self-pay

## 2019-12-13 DIAGNOSIS — E782 Mixed hyperlipidemia: Secondary | ICD-10-CM

## 2019-12-13 DIAGNOSIS — I2510 Atherosclerotic heart disease of native coronary artery without angina pectoris: Secondary | ICD-10-CM | POA: Diagnosis not present

## 2019-12-13 NOTE — Addendum Note (Signed)
Encounter addended by: Scarlette Calico, RN on: 12/13/2019 5:45 PM  Actions taken: Order list changed, Diagnosis association updated

## 2019-12-13 NOTE — Consult Note (Addendum)
Cardiology TeleHealth Note  Due to national recommendations of social distancing due to [place] 19, Audio/video telehealth visit is felt to be most appropriate for this patient at this time.  See MyChart message from today for patient consent regarding telehealth for [facility]. The patient was identified personally using two identifiers.   Date:  12/13/2019   ID:  Brendan Turner, DOB [birth year], MRN [id number]  Location: Home  Provider location: [facility] Type of Visit: New patient PCP:  Marton Redwood, MD   Chief Complaint: Elevated coronary calcium score   History of Present Illness:  Brendan Turner is a 49 y/o male with HL who is referred by Dr. Brigitte Pulse for further evaluation of a high coronary calcium score. He presents via [employer], civil (consulting) for a telehealth visit today.     Says total cholesterol has been around 200 for a while. Never had HTN. He has been very healthy and active running and lifting weights regularly. Eats a healthy diet. Says over the past few months has noticed his workouts have been a bit more difficult with less stamina then he had before. Gets SOB earlier and reports occasional chest tightness with heavy exertion.   Recently noted to have mildly elevated lipids at his PCP visit. Given change in exercise tolerance and elevated lipids they got a coronary calcium score. Coronary artery calcium score markedly elevated at 645 (99th percentile). He presents via telehealth call to discuss next steps.   Says overall he feels OK but still not back to regular exercise levels.   Brendan Turner denies symptoms worrisome for COVID 19.   Past Medical History:  Diagnosis Date  . Gout   . S/P arthroscopy of left knee 2002   Past Surgical History:  Procedure Laterality Date  . APPENDECTOMY    . KNEE SURGERY  2002   left knee arthroscopy  . MANDIBLE FRACTURE SURGERY Left   . NECK MASS EXCISION    . SHOULDER SURGERY Right    2017      Current Outpatient Medications  Medication Sig Dispense Refill  . allopurinol (ZYLOPRIM) 300 MG tablet TK 1 T PO D    . Ascorbic Acid (VITAMIN C) 1000 MG tablet Take 1,000 mg by mouth daily.    . cholecalciferol (VITAMIN D3) 25 MCG (1000 UT) tablet Take 1,000 Units by mouth daily.    . colchicine 0.6 MG tablet Take 1 tablet (0.6 mg total) by mouth daily. (Patient not taking: Reported on 01/16/2019) 30 tablet 0  . Multiple Vitamin (MULTIVITAMIN) tablet Take 1 tablet by mouth daily. Centrum Daily    . Omega-3 Fatty Acids (FISH OIL) 1000 MG CAPS Take by mouth.    . Probiotic Product (PROBIOTIC DAILY PO) Take by mouth daily.     No current facility-administered medications for this encounter.    Allergies:   Patient has no known allergies.   Social History:  The patient  reports that he has quit smoking. He has quit using smokeless tobacco. He reports previous alcohol use. He reports previous drug use.   Family History:  The patient's family history includes Bladder Cancer in his father; Kidney cancer in his father. No h/o premature CAD  ROS:  Please see the history of present illness.   All other systems are personally reviewed and negative.   Exam:  (Video/Tele Health Call; Exam is subjective and or/visual.) General:  Speaks in full sentences. No resp difficulty. Lungs: Normal respiratory effort with conversation.  Abdomen: Non-distended per patient report Extremities: Pt denies edema. Neuro: Alert & oriented x 3.   Recent Labs: No results found for requested labs within last 8760 hours.  Personally reviewed   Wt Readings from Last 3 Encounters:  01/16/19 82.1 kg (181 lb)  01/05/19 82.1 kg (181 lb)  09/11/13 83.9 kg (185 lb)      ASSESSMENT AND PLAN:  1.  Markedly elevated coronary calcium score - CAC much higher than I would have expected given his healthy lifestyle and lack of strong family history. Given recent exercise intolerance with very high CAC  will need  further assessment. We discussed options and have decided to proceed with coronary artery CT to further assess for obstructive CAD - Agree with ASA and statin  2. HL - as above. Agree with statin   COVID screen The patient does not have any symptoms that suggest any further testing/ screening at this time.  Social distancing reinforced today.  Recommended follow-up:  As above  Relevant cardiac medications were reviewed at length with the patient today.   The patient does not have concerns regarding their medications at this time.   The following changes were made today:  As above  Today, I have spent 21 minutes with the patient with telehealth technology discussing the above issues .    Signed, Glori Bickers, MD  12/13/2019 2:47 PM  [facility] [address] [facility] [place] [address] [phone] (office) [phone] (fax)

## 2020-01-01 ENCOUNTER — Telehealth (HOSPITAL_COMMUNITY): Payer: Self-pay

## 2020-01-01 DIAGNOSIS — Z20822 Contact with and (suspected) exposure to covid-19: Secondary | ICD-10-CM | POA: Diagnosis not present

## 2020-01-01 DIAGNOSIS — J069 Acute upper respiratory infection, unspecified: Secondary | ICD-10-CM | POA: Diagnosis not present

## 2020-01-01 NOTE — Telephone Encounter (Signed)
Patient called to inquire about his imaging test. According to scheduling the pre cert is still in clinical review. Patient is wondering if he should just pay out of pocket so he can go ahead and have the imaging test done. He will think about it and give [place] a call back

## 2020-01-24 NOTE — Addendum Note (Signed)
Encounter addended by: Jolaine Artist, MD on: 01/24/2020 12:16 PM  Actions taken: Clinical Note Signed

## 2020-02-02 ENCOUNTER — Telehealth (HOSPITAL_COMMUNITY): Payer: Self-pay | Admitting: Emergency Medicine

## 2020-02-02 NOTE — Telephone Encounter (Signed)
Attempted to call patient regarding upcoming cardiac CT appointment. °Left message on voicemail with name and callback number °Siniya Lichty RN Navigator Cardiac Imaging °[facility] °[phone] Office °[phone] Cell ° °

## 2020-02-05 ENCOUNTER — Other Ambulatory Visit (HOSPITAL_COMMUNITY): Payer: BC Managed Care – PPO

## 2020-02-05 ENCOUNTER — Telehealth (HOSPITAL_COMMUNITY): Payer: Self-pay | Admitting: *Deleted

## 2020-02-05 ENCOUNTER — Ambulatory Visit (HOSPITAL_COMMUNITY): Payer: BC Managed Care – PPO

## 2020-02-05 NOTE — Telephone Encounter (Signed)
Reaching out to patient to offer assistance regarding upcoming cardiac imaging study; pt verbalizes understanding of appt date/time, parking situation and where to check in, pre-test NPO status, and verified current allergies; name and call back number provided for further questions should they arise  [facility] [phone] office [phone] cell

## 2020-02-06 ENCOUNTER — Other Ambulatory Visit: Payer: Self-pay

## 2020-02-06 ENCOUNTER — Ambulatory Visit (HOSPITAL_COMMUNITY)
Admission: RE | Admit: 2020-02-06 | Discharge: 2020-02-06 | Disposition: A | Discharge status: Home / Self Care | Payer: BC Managed Care – PPO | Source: Ambulatory Visit | Attending: Internal Medicine | Admitting: Internal Medicine

## 2020-02-06 DIAGNOSIS — I2510 Atherosclerotic heart disease of native coronary artery without angina pectoris: Secondary | ICD-10-CM | POA: Insufficient documentation

## 2020-02-06 MED ORDER — NITROGLYCERIN 0.4 MG SL SUBL
0.8000 mg | SUBLINGUAL_TABLET | Freq: Once | SUBLINGUAL | Status: AC
  Administered 2020-02-06: 0.8 mg via SUBLINGUAL

## 2020-02-06 MED ORDER — NITROGLYCERIN 0.4 MG SL SUBL
SUBLINGUAL_TABLET | SUBLINGUAL | Status: AC
  Filled 2020-02-06: qty 2

## 2020-02-06 MED ORDER — IOHEXOL 350 MG/ML SOLN
80.0000 mL | Freq: Once | INTRAVENOUS | Status: AC | PRN
  Administered 2020-02-06: 80 mL via INTRAVENOUS

## 2020-02-07 DIAGNOSIS — I2510 Atherosclerotic heart disease of native coronary artery without angina pectoris: Secondary | ICD-10-CM | POA: Diagnosis not present

## 2020-04-25 DIAGNOSIS — M9905 Segmental and somatic dysfunction of pelvic region: Secondary | ICD-10-CM | POA: Diagnosis not present

## 2020-04-25 DIAGNOSIS — M62830 Muscle spasm of back: Secondary | ICD-10-CM | POA: Diagnosis not present

## 2020-04-25 DIAGNOSIS — M9903 Segmental and somatic dysfunction of lumbar region: Secondary | ICD-10-CM | POA: Diagnosis not present

## 2020-04-25 DIAGNOSIS — M9902 Segmental and somatic dysfunction of thoracic region: Secondary | ICD-10-CM | POA: Diagnosis not present

## 2020-04-26 DIAGNOSIS — M9905 Segmental and somatic dysfunction of pelvic region: Secondary | ICD-10-CM | POA: Diagnosis not present

## 2020-04-26 DIAGNOSIS — M9902 Segmental and somatic dysfunction of thoracic region: Secondary | ICD-10-CM | POA: Diagnosis not present

## 2020-04-26 DIAGNOSIS — M62830 Muscle spasm of back: Secondary | ICD-10-CM | POA: Diagnosis not present

## 2020-04-26 DIAGNOSIS — M9903 Segmental and somatic dysfunction of lumbar region: Secondary | ICD-10-CM | POA: Diagnosis not present

## 2020-05-03 DIAGNOSIS — M62830 Muscle spasm of back: Secondary | ICD-10-CM | POA: Diagnosis not present

## 2020-05-03 DIAGNOSIS — M9903 Segmental and somatic dysfunction of lumbar region: Secondary | ICD-10-CM | POA: Diagnosis not present

## 2020-05-03 DIAGNOSIS — M9905 Segmental and somatic dysfunction of pelvic region: Secondary | ICD-10-CM | POA: Diagnosis not present

## 2020-05-03 DIAGNOSIS — M9902 Segmental and somatic dysfunction of thoracic region: Secondary | ICD-10-CM | POA: Diagnosis not present

## 2020-05-07 DIAGNOSIS — M9903 Segmental and somatic dysfunction of lumbar region: Secondary | ICD-10-CM | POA: Diagnosis not present

## 2020-05-07 DIAGNOSIS — M9902 Segmental and somatic dysfunction of thoracic region: Secondary | ICD-10-CM | POA: Diagnosis not present

## 2020-05-07 DIAGNOSIS — M62830 Muscle spasm of back: Secondary | ICD-10-CM | POA: Diagnosis not present

## 2020-05-07 DIAGNOSIS — M9905 Segmental and somatic dysfunction of pelvic region: Secondary | ICD-10-CM | POA: Diagnosis not present

## 2020-06-16 ENCOUNTER — Ambulatory Visit (HOSPITAL_COMMUNITY)
Admission: EM | Admit: 2020-06-16 | Discharge: 2020-06-16 | Disposition: A | Discharge status: Home / Self Care | Payer: BC Managed Care – PPO | Attending: Family Medicine | Admitting: Family Medicine

## 2020-06-16 ENCOUNTER — Ambulatory Visit (INDEPENDENT_AMBULATORY_CARE_PROVIDER_SITE_OTHER): Payer: BC Managed Care – PPO

## 2020-06-16 ENCOUNTER — Other Ambulatory Visit: Payer: Self-pay

## 2020-06-16 ENCOUNTER — Encounter (HOSPITAL_COMMUNITY): Payer: Self-pay

## 2020-06-16 DIAGNOSIS — S42035A Nondisplaced fracture of lateral end of left clavicle, initial encounter for closed fracture: Secondary | ICD-10-CM

## 2020-06-16 DIAGNOSIS — M19019 Primary osteoarthritis, unspecified shoulder: Secondary | ICD-10-CM | POA: Diagnosis not present

## 2020-06-16 DIAGNOSIS — M25512 Pain in left shoulder: Secondary | ICD-10-CM | POA: Diagnosis not present

## 2020-06-16 DIAGNOSIS — W19XXXA Unspecified fall, initial encounter: Secondary | ICD-10-CM

## 2020-06-16 DIAGNOSIS — M7989 Other specified soft tissue disorders: Secondary | ICD-10-CM | POA: Diagnosis not present

## 2020-06-16 NOTE — ED Triage Notes (Signed)
Pt reports going snowboarding and falling on his left shoulder. He states he heard a crack. Pt c/o swelling and pain.

## 2020-06-16 NOTE — Discharge Instructions (Signed)
May take up to 800 mg ibuprofen three times daily as needed. May take up to 1000 mg tylenol up to 4 times daily. Ice off and on

## 2020-06-19 NOTE — ED Provider Notes (Signed)
[facility]    CSN: [id number] Arrival date & time: 06/16/20  1651      History   Chief Complaint Chief Complaint  Patient presents with  . Shoulder Injury    HPI Brendan Turner is a 50 y.o. male.   Here today with left shoulder pain and swelling after falling this afternoon snow skiing directly onto the shoulder. Minimal ability to move at the shoulder. No swelling down arm, numbness, tingling, discoloration. So far using ice and ibuprofen with minimal relief. Hx of AC separation in this shoulder in 2017 s/p surgical repair.      Past Medical History:  Diagnosis Date  . Gout   . S/P arthroscopy of left knee 2002    Patient Active Problem List   Diagnosis Date Noted  . 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 Right shoulder pain 01/05/2011  . PALPITATIONS 02/20/2010  . KNEE PAIN, LEFT, CHRONIC 03/08/2008    Past Surgical History:  Procedure Laterality Date  . APPENDECTOMY    . KNEE SURGERY  2002   left knee arthroscopy  . MANDIBLE FRACTURE SURGERY Left   . NECK MASS EXCISION    . SHOULDER SURGERY Right    2017       Home Medications    Prior to Admission medications   Medication Sig Start Date End Date Taking? Authorizing Provider  aspirin EC 81 MG tablet Take 81 mg by mouth daily. Swallow whole.   Yes [provider]  rosuvastatin (CRESTOR) 10 MG tablet Take 10 mg by mouth daily.   Yes [provider]  allopurinol (ZYLOPRIM) 300 MG tablet TK 1 T PO D Patient not taking: Reported on 02/06/2020 08/24/18   [provider]  Ascorbic Acid (VITAMIN C) 1000 MG tablet Take 1,000 mg by mouth daily.    [provider]  cholecalciferol (VITAMIN D3) 25 MCG (1000 UT) tablet Take 1,000 Units by mouth daily.    [provider]  colchicine 0.6 MG tablet Take 1 tablet (0.6 mg total) by mouth daily. Patient not taking: Reported on 01/16/2019 09/11/13   Carrington Clamp, DPM  Multiple Vitamin (MULTIVITAMIN) tablet Take 1  tablet by mouth daily. Centrum Daily    [provider]  Omega-3 Fatty Acids (FISH OIL) 1000 MG CAPS Take by mouth. Patient not taking: Reported on 02/06/2020    [provider]  Probiotic Product (PROBIOTIC DAILY PO) Take by mouth daily.    [provider]    Family History Family History  Problem Relation Age of Onset  . Kidney cancer Father   . Bladder Cancer Father   . Colon cancer Neg Hx   . Colon polyps Neg Hx   . Esophageal cancer Neg Hx   . Rectal cancer Neg Hx   . Stomach cancer Neg Hx     Social History Social History   Tobacco Use  . Smoking status: Former Games developer  . Smokeless tobacco: Former Neurosurgeon  . Tobacco comment: age 39-30 yo   Vaping Use  . Vaping Use: Never used  Substance Use Topics  . Alcohol use: Not Currently    Comment: stopped 1 yr ago   . Drug use: Not Currently     Allergies   Patient has no known allergies.   Review of Systems Review of Systems PER HPI   Physical Exam Triage Vital Signs ED Triage Vitals  Enc Vitals Group     BP 06/16/20 1736 136/80     Pulse Rate 06/16/20  1736 67     Resp 06/16/20 1736 17     Temp 06/16/20 1736 97.9 F (36.6 C)     Temp Source 06/16/20 1736 Oral     SpO2 06/16/20 1736 98 %     Weight --      Height --      Head Circumference --      Peak Flow --      Pain Score 06/16/20 1735 10     Pain Loc --      Pain Edu? --      Excl. in [place]? --    No data found.  Updated Vital Signs BP 136/80 (BP Location: Right Arm)   Pulse 67   Temp 97.9 F (36.6 C) (Oral)   Resp 17   SpO2 98%   Visual Acuity Right Eye Distance:   Left Eye Distance:   Bilateral Distance:    Right Eye Near:   Left Eye Near:    Bilateral Near:     Physical Exam Vitals and nursing note reviewed.  Constitutional:      Appearance: Normal appearance.  HENT:     Head: Atraumatic.  Eyes:     Extraocular Movements: Extraocular movements intact.     Conjunctiva/sclera: Conjunctivae normal.   Cardiovascular:     Rate and Rhythm: Normal rate and regular rhythm.     Pulses: Normal pulses.  Pulmonary:     Effort: Pulmonary effort is normal. No respiratory distress.     Breath sounds: Normal breath sounds. No wheezing or rales.  Chest:     Chest wall: No tenderness.  Musculoskeletal:        General: Swelling (left shoulder, particularly over distal clavicle), tenderness, deformity (small area of firm swelling vs bony deformity over distal left clavicle, significantly point tender) and signs of injury present. Normal range of motion.     Cervical back: Normal range of motion and neck supple.  Skin:    General: Skin is warm and dry.     Findings: No bruising, erythema or lesion.  Neurological:     General: No focal deficit present.     Mental Status: He is oriented to person, place, and time.     Sensory: No sensory deficit.     Gait: Gait normal.  Psychiatric:        Mood and Affect: Mood normal.        Thought Content: Thought content normal.        Judgment: Judgment normal.      UC Treatments / Results  Labs (all labs ordered are listed, but only abnormal results are displayed) Labs Reviewed - No data to display  EKG   Radiology No results found.  Procedures Procedures (including critical care time)  Medications Ordered in UC Medications - No data to display  Initial Impression / Assessment and Plan / UC Course  I have reviewed the triage vital signs and the nursing notes.  Pertinent labs & imaging results that were available during my care of the patient were reviewed by me and considered in my medical decision making (see chart for details).     X-ray left shoulder showing possible distal clavicle nondisplaced fracture. Sling applied today, discussed OTC pain relievers, ice off and on, and Orthopedic f/u for recheck next week. Return precautions reviewed.   Final Clinical Impressions(s) / UC Diagnoses   Final diagnoses:  None     Discharge  Instructions     May take up to 800  mg ibuprofen three times daily as needed. May take up to 1000 mg tylenol up to 4 times daily. Ice off and on    ED Prescriptions    None     PDMP not reviewed this encounter.   [name] [place], [place] 06/19/20 [phone]

## 2020-07-11 DIAGNOSIS — M25512 Pain in left shoulder: Secondary | ICD-10-CM | POA: Diagnosis not present

## 2020-09-12 ENCOUNTER — Encounter (HOSPITAL_COMMUNITY): Payer: Self-pay | Admitting: Radiology

## 2020-10-18 DIAGNOSIS — Z1212 Encounter for screening for malignant neoplasm of rectum: Secondary | ICD-10-CM | POA: Diagnosis not present

## 2020-10-18 DIAGNOSIS — M109 Gout, unspecified: Secondary | ICD-10-CM | POA: Diagnosis not present

## 2020-10-18 DIAGNOSIS — Z125 Encounter for screening for malignant neoplasm of prostate: Secondary | ICD-10-CM | POA: Diagnosis not present

## 2020-10-18 DIAGNOSIS — R82998 Other abnormal findings in urine: Secondary | ICD-10-CM | POA: Diagnosis not present

## 2020-10-18 DIAGNOSIS — E785 Hyperlipidemia, unspecified: Secondary | ICD-10-CM | POA: Diagnosis not present

## 2020-10-21 DIAGNOSIS — Z1339 Encounter for screening examination for other mental health and behavioral disorders: Secondary | ICD-10-CM | POA: Diagnosis not present

## 2020-10-21 DIAGNOSIS — Z1331 Encounter for screening for depression: Secondary | ICD-10-CM | POA: Diagnosis not present

## 2020-10-21 DIAGNOSIS — Z23 Encounter for immunization: Secondary | ICD-10-CM | POA: Diagnosis not present

## 2020-10-21 DIAGNOSIS — E785 Hyperlipidemia, unspecified: Secondary | ICD-10-CM | POA: Diagnosis not present

## 2020-10-21 DIAGNOSIS — Z0000 Encounter for general adult medical examination without abnormal findings: Secondary | ICD-10-CM | POA: Diagnosis not present

## 2020-12-13 DIAGNOSIS — M62838 Other muscle spasm: Secondary | ICD-10-CM | POA: Diagnosis not present

## 2020-12-13 DIAGNOSIS — M9905 Segmental and somatic dysfunction of pelvic region: Secondary | ICD-10-CM | POA: Diagnosis not present

## 2020-12-13 DIAGNOSIS — M62830 Muscle spasm of back: Secondary | ICD-10-CM | POA: Diagnosis not present

## 2020-12-13 DIAGNOSIS — M9902 Segmental and somatic dysfunction of thoracic region: Secondary | ICD-10-CM | POA: Diagnosis not present

## 2020-12-13 DIAGNOSIS — M9903 Segmental and somatic dysfunction of lumbar region: Secondary | ICD-10-CM | POA: Diagnosis not present

## 2021-02-11 DIAGNOSIS — M545 Low back pain, unspecified: Secondary | ICD-10-CM | POA: Diagnosis not present

## 2021-02-18 DIAGNOSIS — M5451 Vertebrogenic low back pain: Secondary | ICD-10-CM | POA: Diagnosis not present

## 2021-02-22 DIAGNOSIS — J0100 Acute maxillary sinusitis, unspecified: Secondary | ICD-10-CM | POA: Diagnosis not present

## 2021-02-22 DIAGNOSIS — R059 Cough, unspecified: Secondary | ICD-10-CM | POA: Diagnosis not present

## 2021-02-22 DIAGNOSIS — R5383 Other fatigue: Secondary | ICD-10-CM | POA: Diagnosis not present

## 2021-02-22 DIAGNOSIS — Z20822 Contact with and (suspected) exposure to covid-19: Secondary | ICD-10-CM | POA: Diagnosis not present

## 2021-04-01 DIAGNOSIS — M25512 Pain in left shoulder: Secondary | ICD-10-CM | POA: Diagnosis not present

## 2021-04-02 ENCOUNTER — Telehealth (HOSPITAL_COMMUNITY): Payer: Self-pay

## 2021-04-02 NOTE — Telephone Encounter (Signed)
Per Dr. Haroldine Laws patient needs a CT Calcium scoring test done. Order entered.   Orders Placed This Encounter  Procedures   CT CARDIAC SCORING (SELF PAY ONLY)    Standing Status:   Future    Standing Expiration Date:   04/02/2022    Order Specific Question:   Preferred imaging location?    Answer:   [facility] [address]    Order Specific Question:   Release to patient    Answer:   Immediate

## 2021-05-04 DIAGNOSIS — J101 Influenza due to other identified influenza virus with other respiratory manifestations: Secondary | ICD-10-CM | POA: Diagnosis not present

## 2021-05-07 DIAGNOSIS — M24112 Other articular cartilage disorders, left shoulder: Secondary | ICD-10-CM | POA: Diagnosis not present

## 2021-05-07 DIAGNOSIS — M19012 Primary osteoarthritis, left shoulder: Secondary | ICD-10-CM | POA: Diagnosis not present

## 2021-05-07 DIAGNOSIS — M94212 Chondromalacia, left shoulder: Secondary | ICD-10-CM | POA: Diagnosis not present

## 2021-05-07 DIAGNOSIS — G8918 Other acute postprocedural pain: Secondary | ICD-10-CM | POA: Diagnosis not present

## 2021-05-07 DIAGNOSIS — S43432A Superior glenoid labrum lesion of left shoulder, initial encounter: Secondary | ICD-10-CM | POA: Diagnosis not present

## 2021-05-07 DIAGNOSIS — Y999 Unspecified external cause status: Secondary | ICD-10-CM | POA: Diagnosis not present

## 2021-05-07 DIAGNOSIS — X58XXXA Exposure to other specified factors, initial encounter: Secondary | ICD-10-CM | POA: Diagnosis not present

## 2021-05-07 DIAGNOSIS — S46012A Strain of muscle(s) and tendon(s) of the rotator cuff of left shoulder, initial encounter: Secondary | ICD-10-CM | POA: Diagnosis not present

## 2021-05-15 DIAGNOSIS — M25512 Pain in left shoulder: Secondary | ICD-10-CM | POA: Diagnosis not present

## 2021-05-23 DIAGNOSIS — M25512 Pain in left shoulder: Secondary | ICD-10-CM | POA: Diagnosis not present

## 2021-05-29 DIAGNOSIS — M25512 Pain in left shoulder: Secondary | ICD-10-CM | POA: Diagnosis not present

## 2021-06-03 DIAGNOSIS — M25512 Pain in left shoulder: Secondary | ICD-10-CM | POA: Diagnosis not present

## 2021-11-14 DIAGNOSIS — I2510 Atherosclerotic heart disease of native coronary artery without angina pectoris: Secondary | ICD-10-CM | POA: Diagnosis not present

## 2021-11-14 DIAGNOSIS — Z125 Encounter for screening for malignant neoplasm of prostate: Secondary | ICD-10-CM | POA: Diagnosis not present

## 2021-11-14 DIAGNOSIS — M109 Gout, unspecified: Secondary | ICD-10-CM | POA: Diagnosis not present

## 2021-11-20 DIAGNOSIS — Z1339 Encounter for screening examination for other mental health and behavioral disorders: Secondary | ICD-10-CM | POA: Diagnosis not present

## 2021-11-20 DIAGNOSIS — E785 Hyperlipidemia, unspecified: Secondary | ICD-10-CM | POA: Diagnosis not present

## 2021-11-20 DIAGNOSIS — Z1331 Encounter for screening for depression: Secondary | ICD-10-CM | POA: Diagnosis not present

## 2021-11-20 DIAGNOSIS — Z0000 Encounter for general adult medical examination without abnormal findings: Secondary | ICD-10-CM | POA: Diagnosis not present

## 2022-01-22 IMAGING — CT CT CARDIAC CORONARY ARTERY CALCIUM SCORE
3 series · 12 of 20 positions shown, 14 images · non-contrast
Comparison: None.

CLINICAL DATA: Elevated cholesterol.

EXAM:
CT CARDIAC CORONARY ARTERY CALCIUM SCORE
TECHNIQUE: Non-contrast imaging through the heart was performed using
prospective ECG gating. Image post processing was performed on an
independent workstation, allowing for quantitative analysis of the
heart and coronary arteries. Note that this exam targets the heart
and the chest was not imaged in its entirety.

[Series 2: calcium scoring 2.00 qr36 bestdiast 70% hrt calciu · axial · 0.37mm/px · z∈[+1630,+1658]mm · 2 of 70 slices shown]
[im 14/70  vessel]
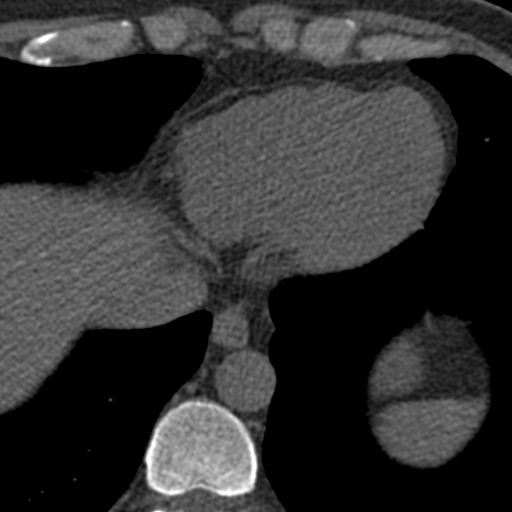
[im 28/70  vessel]
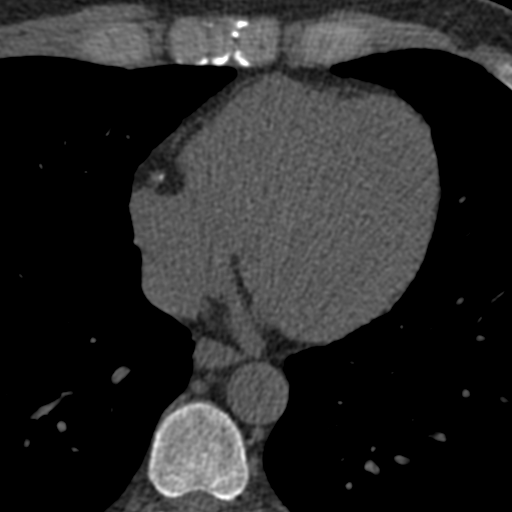

[Series 3: calcium scoring 2.00 br40 bestdiast 70% axial · axial · 0.52mm/px · z∈[+1626,+1718]mm · 5 of 70 slices shown, 7 images]
[im 12/70  vessel]
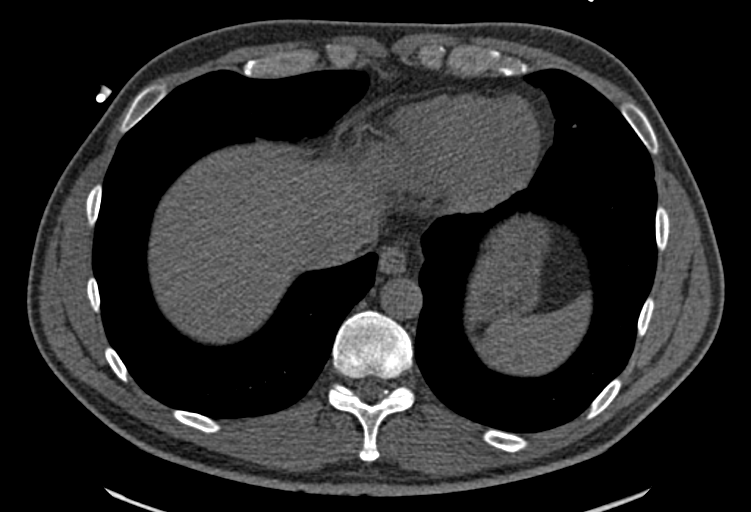
[im 12/70  lung]
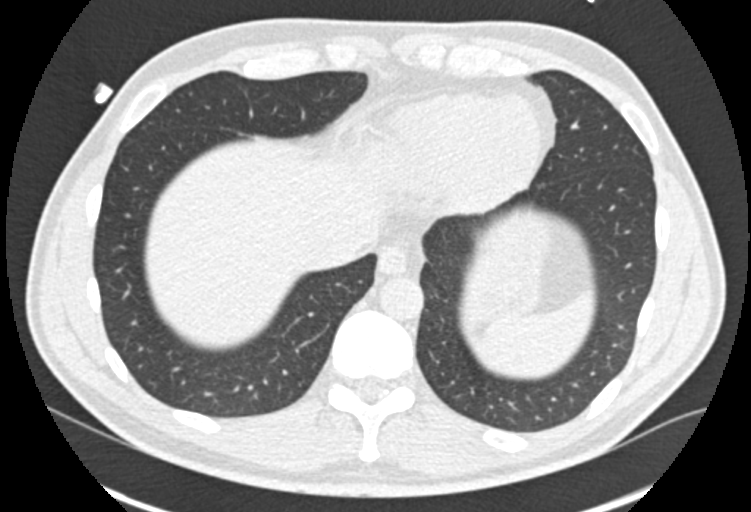
[im 24/70  vessel]
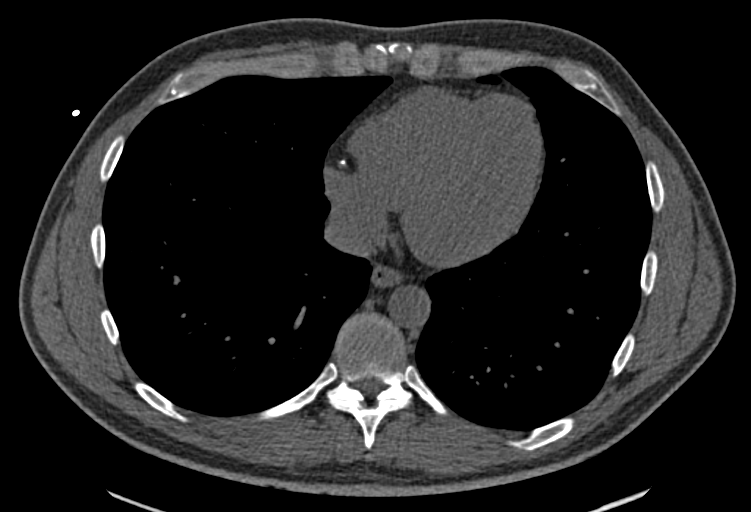
[im 35/70  vessel]
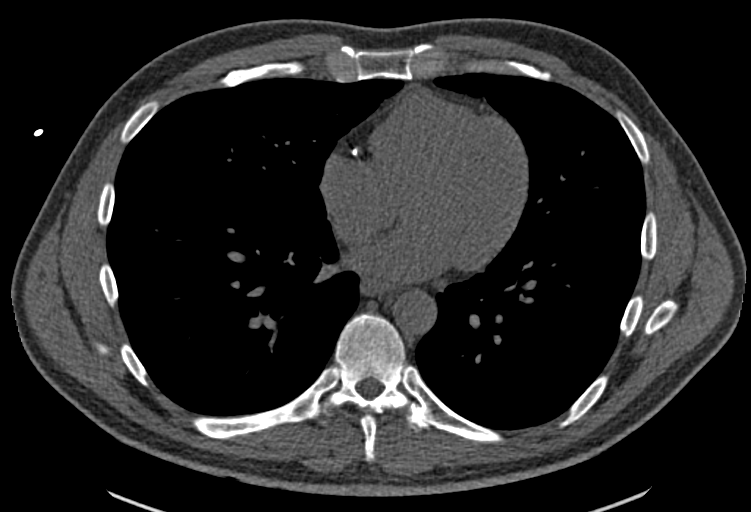
[im 47/70  vessel]
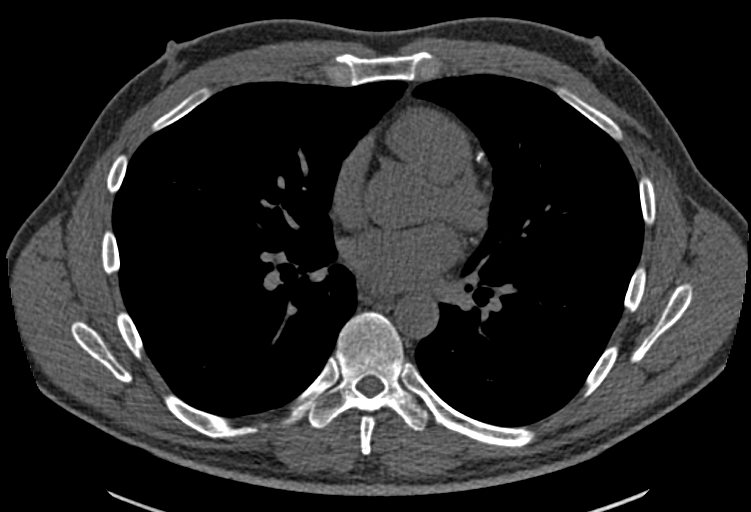
[im 58/70  vessel]
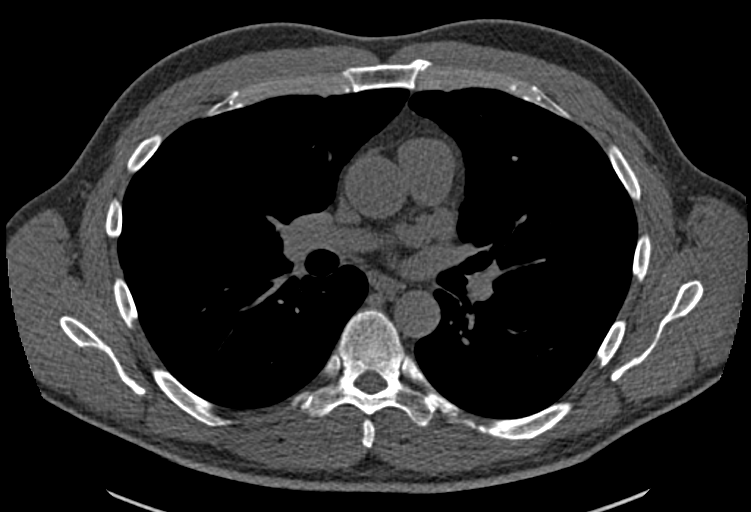
[im 58/70  lung]
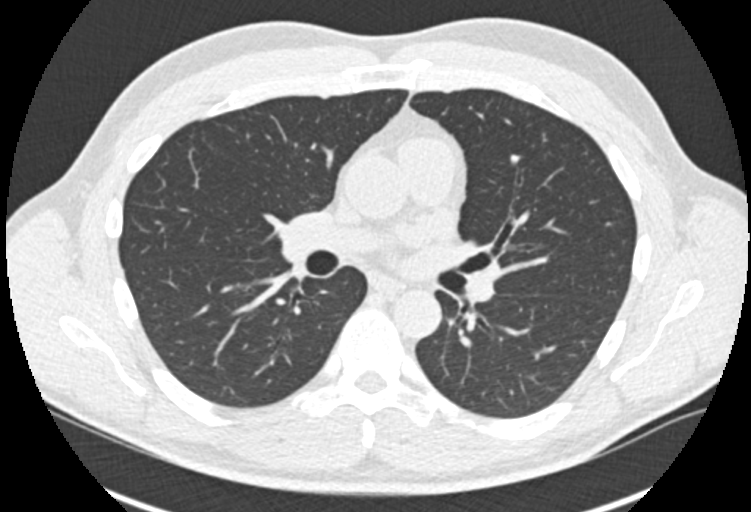

[Series 9: calcium scoring 2.00 br60 bestdiast 70% lungs · axial · 0.52mm/px · z∈[+1626,+1718]mm · 5 of 70 slices shown]
[im 12/70  vessel]
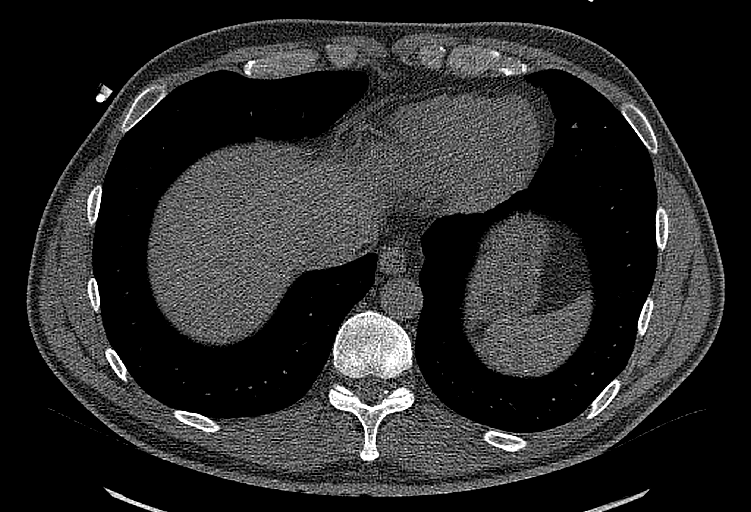
[im 24/70  vessel]
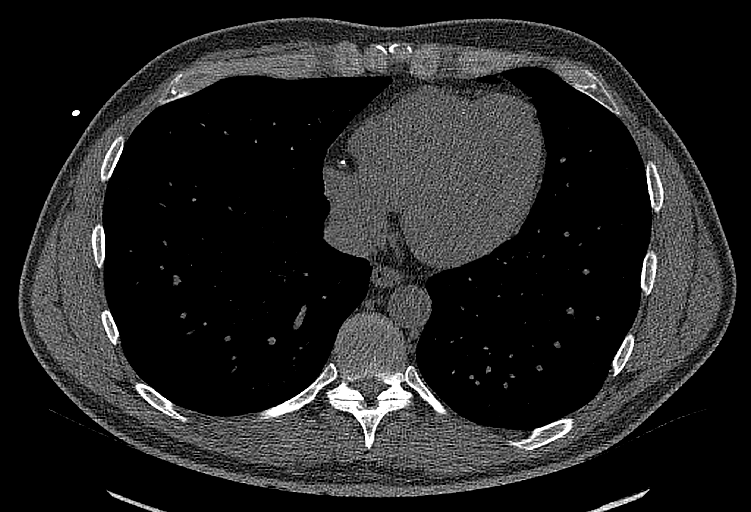
[im 35/70  vessel]
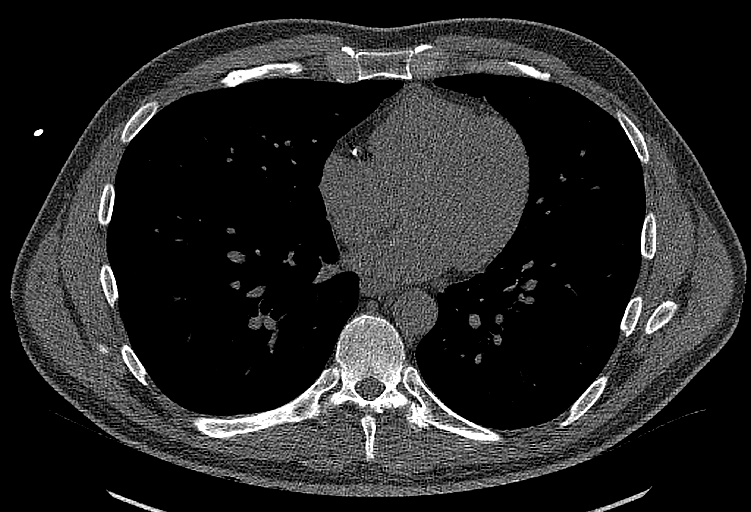
[im 47/70  vessel]
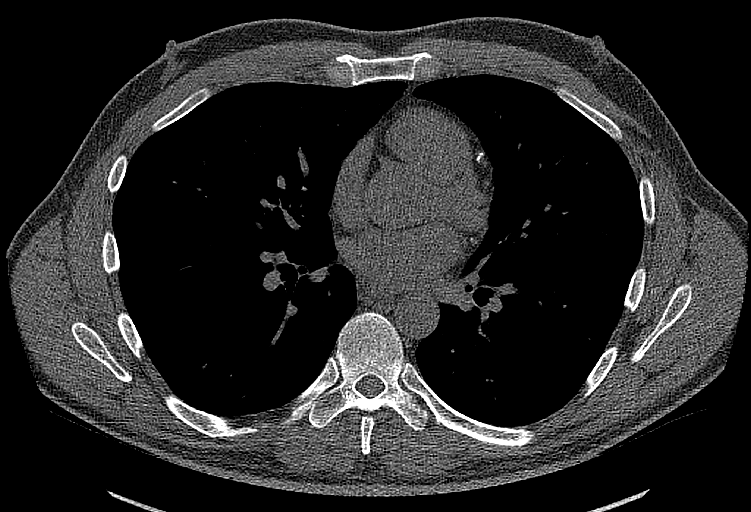
[im 58/70  vessel]
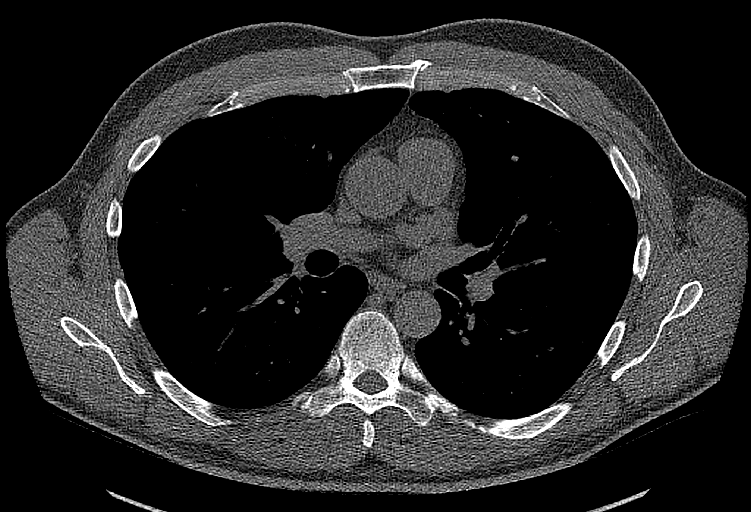

[12 of 20 positions shown; findings below may reference images not displayed]

FINDINGS: CORONARY CALCIUM SCORES:

Left Main: 0

LAD: 233

LCx:

RCA: 371

Total Agatston Score: 645

[HOSPITAL] percentile: 99th

AORTA MEASUREMENTS:

Ascending Aorta: 30 mm

Descending Aorta: 25 mm

OTHER FINDINGS:

Cardiovascular: Normal aortic caliber. Normal heart size, without
pericardial effusion.

Mediastinum/Nodes: No imaged thoracic adenopathy.

Lungs/Pleura: No pleural fluid.  Clear imaged lungs.

Upper Abdomen: Normal imaged portions of the liver, spleen, stomach

Musculoskeletal: No acute osseous abnormality.
IMPRESSION: Total Agatston score of 645, corresponding to 99th percentile for
age, sex, and race based cohort.

## 2022-04-20 DIAGNOSIS — I2510 Atherosclerotic heart disease of native coronary artery without angina pectoris: Secondary | ICD-10-CM | POA: Diagnosis not present

## 2022-04-20 DIAGNOSIS — I10 Essential (primary) hypertension: Secondary | ICD-10-CM | POA: Diagnosis not present

## 2022-05-28 DIAGNOSIS — L820 Inflamed seborrheic keratosis: Secondary | ICD-10-CM | POA: Diagnosis not present

## 2022-05-28 DIAGNOSIS — R82998 Other abnormal findings in urine: Secondary | ICD-10-CM | POA: Diagnosis not present

## 2022-05-28 DIAGNOSIS — E785 Hyperlipidemia, unspecified: Secondary | ICD-10-CM | POA: Diagnosis not present

## 2022-05-28 DIAGNOSIS — I10 Essential (primary) hypertension: Secondary | ICD-10-CM | POA: Diagnosis not present

## 2022-06-02 NOTE — Progress Notes (Signed)
Chief Complaint  Patient presents with   New Patient (Initial Visit)    CAD   History of Present Illness: Brendan Turner is a 52 yo male with history of HTN, CAD and gout who is here today as a new patient, referred by Dr. Brigitte Pulse, for the evaluation of his CAD. Coronary CTA in 2021 with calcium score of 655 and mild three vessel CAD. He tells me today that he is doing well. He has been on an ASA and Crestor since 2021. LDL 49 in January 2024. He is very active. He has no chest pain or dyspnea. No LE edema. No palpitations.   Primary Care Physician: Ginger Organ., MD   Past Medical History:  Diagnosis Date   CAD (coronary artery disease)    Gout    HTN (hypertension)    S/P arthroscopy of left knee 2002    Past Surgical History:  Procedure Laterality Date   APPENDECTOMY     KNEE SURGERY  2002   left knee arthroscopy   MANDIBLE FRACTURE SURGERY Left    NECK MASS EXCISION     SHOULDER ARTHROSCOPY Bilateral    SHOULDER SURGERY Right    2017    Current Outpatient Medications  Medication Sig Dispense Refill   Ascorbic Acid (VITAMIN C) 1000 MG tablet Take 1,000 mg by mouth daily.     aspirin EC 81 MG tablet Take 81 mg by mouth daily. Swallow whole.     cholecalciferol (VITAMIN D3) 25 MCG (1000 UT) tablet Take 1,000 Units by mouth daily.     Coenzyme Q10 (CO Q 10 PO) Take 100 mg by mouth daily.     Multiple Vitamin (MULTIVITAMIN) tablet Take 1 tablet by mouth daily. Centrum Daily     olmesartan (BENICAR) 20 MG tablet Take 20 mg by mouth daily.     Probiotic Product (PROBIOTIC DAILY PO) Take by mouth daily.     rosuvastatin (CRESTOR) 10 MG tablet Take 10 mg by mouth daily.     Turmeric (QC TUMERIC COMPLEX PO) Take 500 mg by mouth daily.     No current facility-administered medications for this visit.    No Known Allergies  Social History   Socioeconomic History   Marital status: Married    Spouse name: Not on file   Number of children: 2   Years of education: Not on file    Highest education level: Not on file  Occupational History   Occupation: Sales  Tobacco Use   Smoking status: Former    Types: Cigarettes   Smokeless tobacco: Never   Tobacco comments:    age [age] yo   Vaping Use   Vaping Use: Never used  Substance and Sexual Activity   Alcohol use: Yes    Comment: Social   Drug use: Not Currently   Sexual activity: Not on file  Other Topics Concern   Not on file  Social History Narrative   Neighbors with vince howard and friends with Linna Hoff Bensimohn who suggested he see me does cycling and some running with them.    Social Determinants of Health   Financial Resource Strain: Not on file  Food Insecurity: Not on file  Transportation Needs: Not on file  Physical Activity: Not on file  Stress: Not on file  Social Connections: Not on file  Intimate Partner Violence: Not on file    Family History  Problem Relation Age of Onset   Hypertension Father    Liver cancer Father  Bladder Cancer Father    Diabetes Father    Colon cancer Neg Hx    Colon polyps Neg Hx    Esophageal cancer Neg Hx    Rectal cancer Neg Hx    Stomach cancer Neg Hx     Review of Systems:  As stated in the HPI and otherwise negative.   BP 128/70   Pulse (!) 54   Ht '5\' 11"'$  (1.803 m)   Wt 86.5 kg   SpO2 99%   BMI 26.58 kg/m   Physical Examination: General: Well developed, well nourished, NAD  HEENT: OP clear, mucus membranes moist  SKIN: warm, dry. No rashes. Neuro: No focal deficits  Musculoskeletal: Muscle strength 5/5 all ext  Psychiatric: Mood and affect normal  Neck: No JVD, no carotid bruits, no thyromegaly, no lymphadenopathy.  Lungs:Clear bilaterally, no wheezes, rhonci, crackles Cardiovascular: Regular rate and rhythm. No murmurs, gallops or rubs. Abdomen:Soft. Bowel sounds present. Non-tender.  Extremities: No lower extremity edema. Pulses are 2 + in the bilateral DP/PT.  EKG:  EKG is ordered today. The ekg ordered today demonstrates Sinus,  poor R wave progression in the precordial leads.   Recent Labs: No results found for requested labs within last 365 days.   Lipid Panel No results found for: "CHOL", "TRIG", "HDL", "CHOLHDL", "VLDL", "LDLCALC", "LDLDIRECT"   Wt Readings from Last 3 Encounters:  06/03/22 86.5 kg  01/16/19 82.1 kg  01/05/19 82.1 kg    Assessment and Plan:   1. CAD without angina: He has no chest pain or dyspnea. Mild CAD by coronary CTA in 2021. Will continue ASA and statin. LDL at goal in January 2024 (46).   2. HTN: BP is controlled. Continue Olmesartan. Will arrange an echo to assess LV size and function.   Labs/ tests ordered today include:   Orders Placed This Encounter  Procedures   EKG 12-Lead   ECHOCARDIOGRAM COMPLETE   Disposition:   F/U with me in one year  Signed, Lauree Chandler, MD, [facility] 06/03/2022 10:11 AM    [address], [place], [place]  [address] Phone: [phone]; Fax: [phone]

## 2022-06-03 ENCOUNTER — Encounter: Payer: Self-pay | Admitting: Cardiovascular Disease

## 2022-06-03 ENCOUNTER — Ambulatory Visit: Payer: BC Managed Care – PPO | Attending: Cardiovascular Disease | Admitting: Cardiovascular Disease

## 2022-06-03 VITALS — BP 128/70 | HR 54 | Ht 71.0 in | Wt 190.6 lb

## 2022-06-03 DIAGNOSIS — I2510 Atherosclerotic heart disease of native coronary artery without angina pectoris: Secondary | ICD-10-CM

## 2022-06-03 DIAGNOSIS — I10 Essential (primary) hypertension: Secondary | ICD-10-CM | POA: Diagnosis not present

## 2022-06-03 NOTE — Patient Instructions (Signed)
Medication Instructions:  No changes *If you need a refill on your cardiac medications before your next appointment, please call your pharmacy*   Lab Work: none   Testing/Procedures: Your physician has requested that you have an echocardiogram. Echocardiography is a painless test that uses sound waves to create images of your heart. It provides your doctor with information about the size and shape of your heart and how well your heart's chambers and valves are working. This procedure takes approximately one hour. There are no restrictions for this procedure. Please do NOT wear cologne, perfume, aftershave, or lotions (deodorant is allowed). Please arrive 15 minutes prior to your appointment time.    Follow-Up: At [facility], you and your health needs are our priority.  As part of our continuing mission to provide you with exceptional heart care, we have created designated Provider Care Teams.  These Care Teams include your primary Cardiologist (physician) and Advanced Practice Providers (APPs -  Physician Assistants and Nurse Practitioners) who all work together to provide you with the care you need, when you need it.    Your next appointment:   12 month(s)  Provider:   Christopher McAlhany, MD     

## 2022-06-22 DIAGNOSIS — M79645 Pain in left finger(s): Secondary | ICD-10-CM | POA: Diagnosis not present

## 2022-06-23 ENCOUNTER — Other Ambulatory Visit (HOSPITAL_COMMUNITY): Payer: BC Managed Care – PPO

## 2022-07-21 ENCOUNTER — Ambulatory Visit (HOSPITAL_COMMUNITY): Payer: BC Managed Care – PPO | Attending: Cardiovascular Disease

## 2022-07-21 DIAGNOSIS — I2510 Atherosclerotic heart disease of native coronary artery without angina pectoris: Secondary | ICD-10-CM

## 2022-07-21 LAB — ECHOCARDIOGRAM COMPLETE
Area-P 1/2: 2.57 cm2
S' Lateral: 3 cm

## 2022-08-02 IMAGING — DX DG SHOULDER 2+V*L*
3 series · 3 of 3 positions shown · non-contrast
Comparison: None.

CLINICAL DATA: Fall on left shoulder wall snowboarding, pain and
swelling with decreased range of motion. Heard a crack.

EXAM:
LEFT SHOULDER - 2+ VIEW

[shoulder grashey]
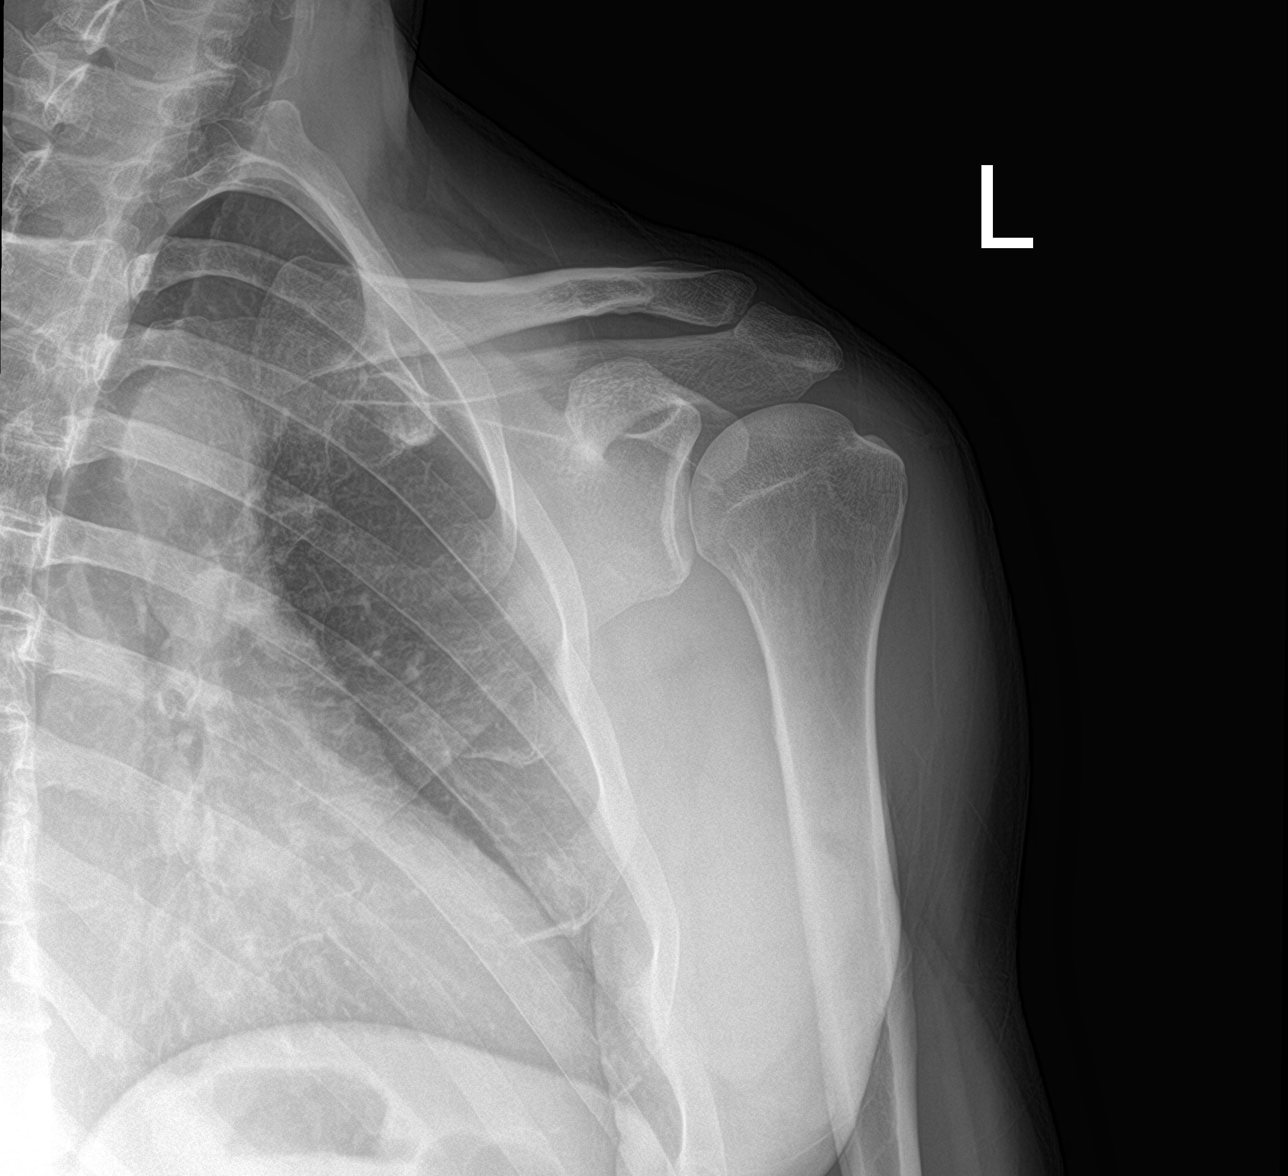

[shoulder y-view]
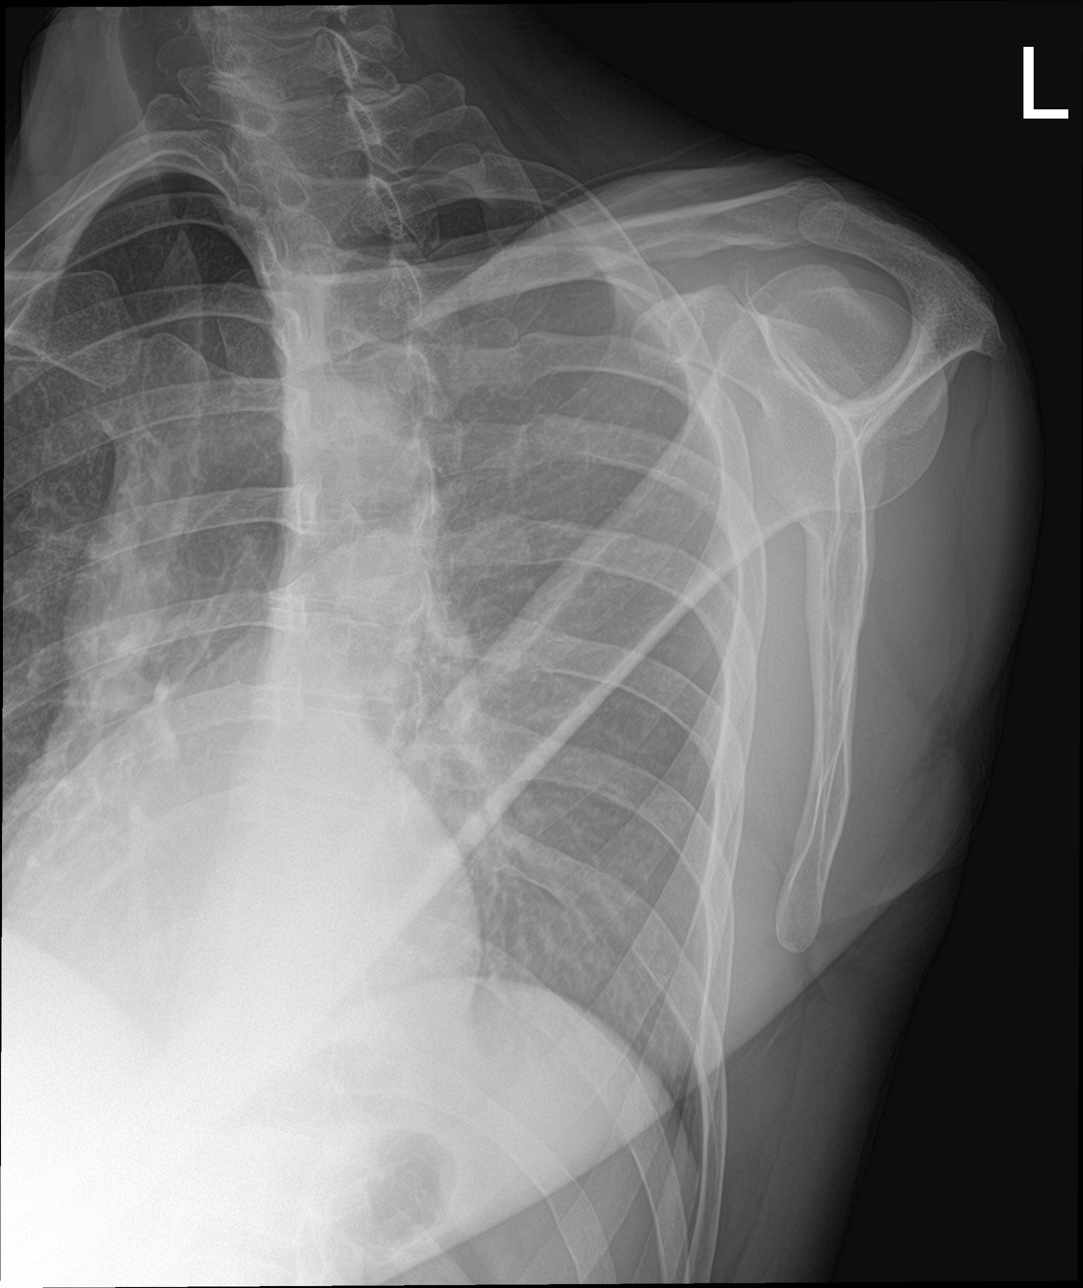

[shoulder axial]
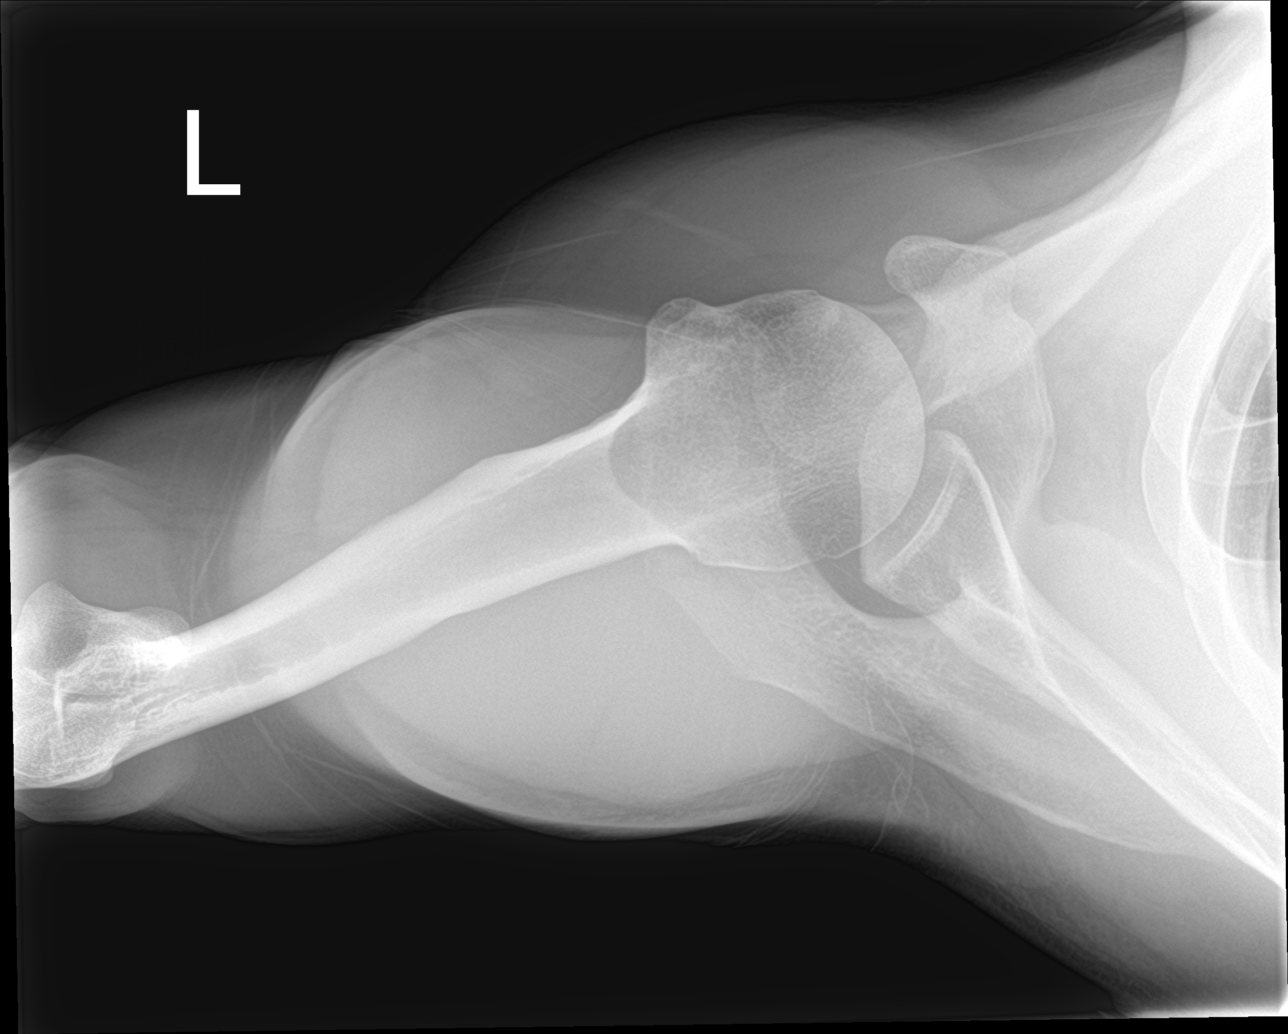

[3 of 3 positions shown; findings below may reference images not displayed]

FINDINGS: Minimal irregularity about the distal left clavicle is seen only on
the Grashey view. No other fracture. Normal alignment.
Acromioclavicular joint is normal. No abnormality in the included
portion of the chest.
IMPRESSION: 1. Minimal irregularity about the distal left clavicle, seen only on
the Grashey view. This may be a nondisplaced fracture versus
projectional undulation. Recommend dedicated clavicle exam.
2. No other fracture of the shoulder.

## 2022-09-06 ENCOUNTER — Encounter (HOSPITAL_COMMUNITY): Payer: Self-pay

## 2022-09-06 ENCOUNTER — Ambulatory Visit (HOSPITAL_COMMUNITY)
Admission: EM | Admit: 2022-09-06 | Discharge: 2022-09-06 | Disposition: A | Discharge status: Home / Self Care | Payer: BC Managed Care – PPO | Attending: Family Medicine | Admitting: Family Medicine

## 2022-09-06 DIAGNOSIS — S61412A Laceration without foreign body of left hand, initial encounter: Secondary | ICD-10-CM | POA: Diagnosis not present

## 2022-09-06 MED ORDER — CEPHALEXIN 500 MG PO CAPS: 500.0000 mg | ORAL_CAPSULE | Freq: Three times a day (TID) | ORAL | 0 refills | Status: AC

## 2022-09-06 MED ORDER — CEPHALEXIN 250 MG PO CAPS: 250.0000 mg | ORAL_CAPSULE | Freq: Three times a day (TID) | ORAL | 0 refills | Status: DC

## 2022-09-06 MED ORDER — MUPIROCIN 2 % EX OINT: 1.0000 | TOPICAL_OINTMENT | Freq: Two times a day (BID) | CUTANEOUS | 0 refills | Status: AC

## 2022-09-06 NOTE — ED Triage Notes (Signed)
"  Cut/laceration of left hand/palm". "I super glued it, not sure if it worked". DOI: [id number]. Time: 500pm. "Doing some yard work, caught on fence". UTD with Tdap (per patient).

## 2022-09-06 NOTE — Discharge Instructions (Signed)
Take cephalexin 500 mg--1 capsule 3 times daily for 5 days  Put mupirocin ointment on the sore areas twice daily until improved   

## 2022-09-06 NOTE — ED Provider Notes (Signed)
[facility]    CSN: [id number] Arrival date & time: 09/06/22  1040      History   Chief Complaint Chief Complaint  Patient presents with   Laceration    HPI Brendan Turner is a 52 y.o. male.    Laceration  Here for a cut to his left palm.  Yesterday at 5 PM he was doing yard work and his left palm in the webspace between his thumb and index finger.  Since it was getting late, he superglue did to try to keep from going to the emergency room today.  It is oozing a little bit around the suprapubic area, so he is coming in today for [place] to check out the wound and see if anything else should be done. He is examined at 1130, more than 18 hours after the injury.    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 Authorizing Provider  Ascorbic Acid (VITAMIN C) 1000 MG tablet Take 1,000 mg by mouth daily.   Yes [provider]  aspirin 81 MG chewable tablet Chew by mouth. 01/01/20  Yes [provider]  cephALEXin (KEFLEX) 500 MG capsule Take 1 capsule (500 mg total) by mouth 3 (three) times daily for 5 days. 09/06/22 09/11/22 Yes BanisterJanace Aris, MD  cholecalciferol (VITAMIN D3) 25 MCG (1000 UT) tablet Take 1,000 Units by mouth daily.   Yes [provider]  Coenzyme Q10 (CO Q 10 PO) Take 100 mg by mouth daily.   Yes [provider]  Multiple Vitamin (MULTIVITAMIN) tablet Take 1 tablet by mouth daily. Centrum Daily   Yes [provider]  mupirocin ointment (BACTROBAN) 2 % Apply 1 Application topically 2 (two) times daily. To affected area till better 09/06/22  Yes Wyatte Dames, Janace Aris, MD  olmesartan (BENICAR) 20 MG tablet Take 20 mg by mouth daily. 05/17/22  Yes [provider]  Probiotic Product (PROBIOTIC DAILY PO) Take by mouth daily.   Yes [provider]  rosuvastatin (CRESTOR) 10 MG tablet Take 10 mg by mouth daily.   Yes [provider]  Turmeric (QC TUMERIC COMPLEX PO) Take 500 mg by mouth daily.   Yes [provider]    Family History Family History  Problem Relation Age of Onset   Hypertension Father    Liver cancer Father    Bladder Cancer Father    Diabetes Father    Colon cancer Neg Hx    Colon polyps Neg Hx    Esophageal cancer Neg Hx    Rectal cancer Neg Hx    Stomach cancer Neg Hx  Social History Social History   Tobacco Use   Smoking status: Former    Types: Cigarettes   Smokeless tobacco: Never   Tobacco comments:    age [age] yo   Vaping Use   Vaping Use: Never used  Substance Use Topics   Alcohol use: Yes    Comment: Social   Drug use: Not Currently     Allergies   Patient has no known allergies.   Review of Systems Review of Systems   Physical Exam Triage Vital Signs ED Triage Vitals  Enc Vitals Group     BP 09/06/22 1126 127/77     Pulse Rate 09/06/22 1126 62     Resp 09/06/22 1126 16     Temp 09/06/22 1126 98.7 F (37.1 C)     Temp Source 09/06/22 1126 Oral     SpO2 09/06/22 1126 97 %     Weight 09/06/22 1123 185 lb (83.9 kg)     Height 09/06/22 1123  (1.803 m)     Head Circumference --      Peak Flow --      Pain Score 09/06/22 1123 0     Pain Loc --      Pain Edu? --      Excl. in GC? --    No data  found.  Updated Vital Signs BP 127/77 (BP Location: Right Arm)   Pulse 62   Temp 98.7 F (37.1 C) (Oral)   Resp 16   Ht  (1.803 m)   Wt 83.9 kg   SpO2 97%   BMI 25.80 kg/m   Visual Acuity Right Eye Distance:   Left Eye Distance:   Bilateral Distance:    Right Eye Near:   Left Eye Near:    Bilateral Near:     Physical Exam Vitals reviewed.  Constitutional:      General: He is not in acute distress.    Appearance: He is not ill-appearing, toxic-appearing or diaphoretic.  Skin:    Coloration: Skin is not jaundiced or pale.     Comments: There is a laceration that is about 2 cm in length over the palm and webspace between the thumb and left index finger.  There is some hard substance overlying it that is consistent with superglue.  It is oozing a little bit of blood around the edges.  No erythema and no induration and no purulent drainage.   Neurological:     General: No focal deficit present.     Mental Status: He is alert and oriented to person, place, and time.  Psychiatric:        Behavior: Behavior normal.      UC Treatments / Results  Labs (all labs ordered are listed, but only abnormal results are displayed) Labs Reviewed - No data to display  EKG   Radiology No results found.  Procedures Procedures (including critical care time)  Medications Ordered in UC Medications - No data to display  Initial Impression / Assessment and Plan / UC Course  I have reviewed the triage vital signs and the nursing notes.  Pertinent labs & imaging results that were available during my care of the patient were reviewed by me and considered in my medical decision making (see chart for details).         With his being past the 18-hour mark since the injury happened, I do not think it is in his best interest to try to soak off the superglue, as  it is past the 18-hour mark this injury and puts him at risk for wound infection.  Since it still oozing a little,  Bactroban and Keflex are sent in to prevent wound infection.  Wound care is explained  He states he has had a tetanus booster in the last 5 years.  Final Clinical Impressions(s) / UC Diagnoses   Final diagnoses:  Laceration of left hand without foreign body, initial encounter     Discharge Instructions      Take cephalexin 500 mg--1 capsule 3 times daily for 5 days  Put mupirocin ointment on the sore areas twice daily until improved       ED Prescriptions     Medication Sig Dispense Auth. Provider   cephALEXin (KEFLEX) 250 MG capsule  (Status: Discontinued) Take 1 capsule (250 mg total) by mouth 3 (three) times daily for 5 days. 15 capsule Zenia Resides, MD   mupirocin ointment (BACTROBAN) 2 % Apply 1 Application topically 2 (two) times daily. To affected area till better 22 g Zenia Resides, MD   cephALEXin (KEFLEX) 500 MG capsule Take 1 capsule (500 mg total) by mouth 3 (three) times daily for 5 days. 15 capsule Marlinda Mike Janace Aris, MD      PDMP not reviewed this encounter.   Zenia Resides, MD 09/06/22 [phone]

## 2023-10-29 ENCOUNTER — Ambulatory Visit (INDEPENDENT_AMBULATORY_CARE_PROVIDER_SITE_OTHER)

## 2023-10-29 ENCOUNTER — Encounter: Payer: Self-pay | Admitting: Podiatry

## 2023-10-29 ENCOUNTER — Ambulatory Visit (INDEPENDENT_AMBULATORY_CARE_PROVIDER_SITE_OTHER): Admitting: Podiatry

## 2023-10-29 DIAGNOSIS — M7751 Other enthesopathy of right foot: Secondary | ICD-10-CM | POA: Diagnosis not present

## 2023-10-29 DIAGNOSIS — M778 Other enthesopathies, not elsewhere classified: Secondary | ICD-10-CM

## 2023-10-29 MED ORDER — TRIAMCINOLONE ACETONIDE 10 MG/ML IJ SUSP
10.0000 mg | Freq: Once | INTRAMUSCULAR | Status: AC
  Administered 2023-10-29: 10 mg via INTRA_ARTICULAR

## 2023-10-29 NOTE — Progress Notes (Signed)
 Subjective:   Patient ID: Brendan Turner, male   DOB: 53 y.o.   MRN: [id number]   HPI Patient states he has a lot of pain in his right foot 3 to 4 weeks duration does not remember injury likes to run and states that it impedes that and he has not been able to do that activity.  Patient does not smoke likes to be active   Review of Systems  All other systems reviewed and are negative.       Objective:  Physical Exam Vitals and nursing note reviewed.  Constitutional:      Appearance: He is well-developed.  Pulmonary:     Effort: Pulmonary effort is normal.   Musculoskeletal:        General: Normal range of motion.   Skin:    General: Skin is warm.   Neurological:     Mental Status: He is alert.     Neurovascular status intact muscle strength adequate range of motion within normal limits with exquisite discomfort second metatarsal phalangeal joint right with fluid buildup around the joint and pain with palpation.  Patient is noted to have good digital perfusion well oriented x 3     Assessment:  Acute capsulitis second MPJ right cannot rule out neuroma cannot rule out any other pathology but most likely this is the condition     Plan:  H&P x-ray reviewed and I went ahead today anesthetized the right forefoot 60 mg like Marcaine  mixture I exposed the joint did sterile prep aspirated the joint getting out a small amount of a clearish fluid and I then injected with 1/4 cc dexamethasone Kenalog apply thick plantar padding advised on rigid shoes and reappoint as symptoms indicate  Negative for signs that there is a fracture negative for signs of any arthritis or bony injury associated with pain condition

## 2024-02-01 ENCOUNTER — Ambulatory Visit (INDEPENDENT_AMBULATORY_CARE_PROVIDER_SITE_OTHER): Admitting: Sports Medicine

## 2024-02-01 VITALS — BP 110/80 | Ht 71.5 in | Wt 190.0 lb

## 2024-02-01 DIAGNOSIS — M2141 Flat foot [pes planus] (acquired), right foot: Secondary | ICD-10-CM

## 2024-02-01 NOTE — Progress Notes (Addendum)
 PCP: Loreli Elsie JONETTA Mickey., MD  Subjective:   HPI: Patient is a 53 y.o. male here for right 2nd and 3rd toe pain.  Patient has a pertinent history of gout and was seen by podiatry for dorsal pain over his 2nd and 3rd toes.  X-ray was performed that did not show any abnormality.  The podiatrist pulled fluid off and injected this area with a steroid in July.  He had no relief from this injection and the fluid was negative for any infection.  He is a runner and feels pain in the area when he pushes off with dorsiflexion.  The pain can wax and wane and he can have pain with prolonged walking.  He is not taking any anti-inflammatories.  Patient denies any redness or swelling in the area as he has previously had gout flares.  Past Medical History:  Diagnosis Date   CAD (coronary artery disease)    Gout    HTN (hypertension)    S/P arthroscopy of left knee 2002    Current Outpatient Medications on File Prior to Visit  Medication Sig Dispense Refill   allopurinol (ZYLOPRIM) 300 MG tablet Take 300 mg by mouth daily.     ezetimibe (ZETIA) 10 MG tablet Take 10 mg by mouth daily.     rosuvastatin (CRESTOR) 10 MG tablet Take 10 mg by mouth daily.     Ascorbic Acid (VITAMIN C) 1000 MG tablet Take 1,000 mg by mouth daily.     aspirin 81 MG chewable tablet Chew by mouth.     cholecalciferol (VITAMIN D3) 25 MCG (1000 UT) tablet Take 1,000 Units by mouth daily.     Coenzyme Q10 (CO Q 10 PO) Take 100 mg by mouth daily.     Multiple Vitamin (MULTIVITAMIN) tablet Take 1 tablet by mouth daily. Centrum Daily     mupirocin  ointment (BACTROBAN ) 2 % Apply 1 Application topically 2 (two) times daily. To affected area till better 22 g 0   olmesartan (BENICAR) 20 MG tablet Take 20 mg by mouth daily.     Probiotic Product (PROBIOTIC DAILY PO) Take by mouth daily.     Turmeric (QC TUMERIC COMPLEX PO) Take 500 mg by mouth daily.     No current facility-administered medications on file prior to visit.    BP 110/80    Ht 5' 11.5 (1.816 m)   Wt 190 lb (86.2 kg)   BMI 26.13 kg/m        Objective:   Physical Exam:  Gen: NAD, comfortable in exam room Bilateral feet: Patient has preservation of his long arch.  Mild transverse collapse bilaterally.  Mild hallux valgus bilaterally.  Bunionette's present bilaterally.  Forefoot measuring is 13 cm wide on the right, 12 cm on the left.  No calluses present.  Right foot Inspection: No erythema warmth or edema Palpation: Mild tenderness to palpation over the dorsal heads of the MTPs of 2nd and 3rd ROM: Full dorsiflexion plantarflexion, with pain present with dorsiflexion at the plantar aspect of the 2nd and 3rd toes Special Tests: Negative anterior drawer, negative heel squeeze, negative forefoot squeeze Neuro: Pulses are palpable and symmetric, sensation is intact bilaterally  Patient was fitted for a standard, cushioned, sports orthotic.   size: 12 base: Sports orthotic posting: none additional orthotic padding: Right metatarsal pad Orthotics fit well and patients gait: Felt improved and was comfortable when walking  Assessment/Plan:   Brendan Turner is a 53 y.o. male who was seen today for the following:  1. Right Transverse Arch Collapse (Primary) - Patient has mild collapse of his right transverse arch - Patient was fitted with a sports insole as well as a right metatarsal pad - Patient walked around the clinic and felt that he had improved pain - Instructed patient that he should attempt to run with this new insole and see how he does - We will follow-up with him in 1 month and at that time may pursue ultrasound of the right foot  Follow-up/Education:   Return in about 1 month (around 03/02/2024).   May return sooner as needed and encouraged to call/e-mail for additional questions or  worsening symptoms in the interim.  Krystal Lowing, DO Sports Medicine Fellow 02/01/2024 1:12 PM  I observed and examined the patient with the [facility] resident  and agree with assessment and plan.  Note reviewed and modified by me. PATRICE Haddock, MD

## 2024-04-10 ENCOUNTER — Encounter: Payer: Self-pay | Admitting: Cardiovascular Disease

## 2024-04-10 ENCOUNTER — Ambulatory Visit: Attending: Cardiovascular Disease | Admitting: Cardiovascular Disease

## 2024-04-10 VITALS — BP 126/86 | HR 56 | Ht 71.5 in | Wt 200.8 lb

## 2024-04-10 DIAGNOSIS — I10 Essential (primary) hypertension: Secondary | ICD-10-CM | POA: Diagnosis not present

## 2024-04-10 DIAGNOSIS — I2510 Atherosclerotic heart disease of native coronary artery without angina pectoris: Secondary | ICD-10-CM | POA: Diagnosis not present

## 2024-04-10 NOTE — Progress Notes (Signed)
 Chief Complaint  Patient presents with   Follow-up    CAD   History of Present Illness: Brendan Turner is a 53 yo male with history of CAD and gout who is here today for follow up. He was seen as a new patient in our office in January 2024. Coronary CTA in 2021 with calcium score of 655 and mild three vessel CAD. Echo March 2024 with LVEF=60-65%.  Mild MR. He had been on Benicar for HTN but has stopped this due to normal blood pressures.   He is here today for follow up. The patient denies any chest pain, dyspnea, palpitations, lower extremity edema, orthopnea, PND, dizziness, near syncope or syncope. He is very active.   His oldest daughter is graduating from [employer] in May 2026 and his youngest daughter is graduation from [employer] in June 2026.   Primary Care Physician: Loreli Elsie JONETTA Mickey., MD   Past Medical History:  Diagnosis Date   CAD (coronary artery disease)    Gout    HTN (hypertension)    S/P arthroscopy of left knee 2002    Past Surgical History:  Procedure Laterality Date   APPENDECTOMY     KNEE SURGERY  2002   left knee arthroscopy   MANDIBLE FRACTURE SURGERY Left    NECK MASS EXCISION     SHOULDER ARTHROSCOPY Bilateral    SHOULDER SURGERY Right    2017    Current Outpatient Medications  Medication Sig Dispense Refill   allopurinol (ZYLOPRIM) 300 MG tablet Take 300 mg by mouth daily.     Ascorbic Acid (VITAMIN C) 1000 MG tablet Take 1,000 mg by mouth daily.     aspirin 81 MG chewable tablet Chew by mouth.     cholecalciferol (VITAMIN D3) 25 MCG (1000 UT) tablet Take 1,000 Units by mouth daily.     Coenzyme Q10 (CO Q 10 PO) Take 100 mg by mouth daily.     ezetimibe (ZETIA) 10 MG tablet Take 10 mg by mouth daily.     Multiple Vitamin (MULTIVITAMIN) tablet Take 1 tablet by mouth daily. Centrum Daily     mupirocin  ointment (BACTROBAN ) 2 % Apply 1 Application topically 2 (two) times daily. To affected area till better 22 g 0   olmesartan (BENICAR) 20 MG tablet Take 20 mg by  mouth daily.     Probiotic Product (PROBIOTIC DAILY PO) Take by mouth daily.     rosuvastatin (CRESTOR) 10 MG tablet Take 10 mg by mouth daily.     Turmeric (QC TUMERIC COMPLEX PO) Take 500 mg by mouth daily.     No current facility-administered medications for this visit.    No Known Allergies  Social History   Socioeconomic History   Marital status: Married    Spouse name: Not on file   Number of children: 2   Years of education: Not on file   Highest education level: Not on file  Occupational History   Occupation: Sales  Tobacco Use   Smoking status: Former    Types: Cigarettes   Smokeless tobacco: Never   Tobacco comments:    age [age] yo   Vaping Use   Vaping status: Never Used  Substance and Sexual Activity   Alcohol  use: Yes    Comment: Social   Drug use: Not Currently   Sexual activity: Not on file  Other Topics Concern   Not on file  Social History Narrative   Neighbors with vince howard and friends with Dan Bensimohn who suggested  he see me does cycling and some running with them.    Social Drivers of Corporate Investment Banker Strain: Not on file  Food Insecurity: Not on file  Transportation Needs: Not on file  Physical Activity: Not on file  Stress: Not on file  Social Connections: Unknown (09/28/2021)   Received [facility]   Social Network    Social Network: Not on file  Intimate Partner Violence: Unknown (08/20/2021)   Received from [facility]   HITS    Physically Hurt: Not on file    Insult or Talk Down To: Not on file    Threaten Physical Harm: Not on file    Scream or Curse: Not on file    Family History  Problem Relation Age of Onset   Hypertension Father    Liver cancer Father    Bladder Cancer Father    Diabetes Father    Colon cancer Neg Hx    Colon polyps Neg Hx    Esophageal cancer Neg Hx    Rectal cancer Neg Hx    Stomach cancer Neg Hx     Review of Systems:  As stated in the HPI and otherwise negative.   BP  126/86   Pulse (!) 56   Ht 5' 11.5 (1.816 m)   Wt 200 lb 12.8 oz (91.1 kg)   SpO2 98%   BMI 27.62 kg/m   Physical Examination: General: Well developed, well nourished, NAD  HEENT: OP clear, mucus membranes moist  SKIN: warm, dry. No rashes. Neuro: No focal deficits  Musculoskeletal: Muscle strength 5/5 all ext  Psychiatric: Mood and affect normal  Neck: No JVD, no carotid bruits, no thyromegaly, no lymphadenopathy.  Lungs:Clear bilaterally, no wheezes, rhonci, crackles Cardiovascular: Regular rate and rhythm. No murmurs, gallops or rubs. Abdomen:Soft. Bowel sounds present. Non-tender.  Extremities: No lower extremity edema. Pulses are 2 + in the bilateral DP/PT.  EKG:  EKG is ordered today. The ekg ordered today demonstrates  EKG Interpretation Date/Time:  Monday April 10 2024 08:23:30 EST Ventricular Rate:  60 PR Interval:  164 QRS Duration:  84 QT Interval:  408 QTC Calculation: 408 R Axis:   14  Text Interpretation: Normal sinus rhythm Normal ECG Confirmed by Verlin Bruckner [phone]) on 04/10/2024 8:29:33 AM    Recent Labs: No results found for requested labs within last 365 days.   Lipid Panel No results found for: CHOL, TRIG, HDL, CHOLHDL, VLDL, LDLCALC, LDLDIRECT   Wt Readings from Last 3 Encounters:  04/10/24 200 lb 12.8 oz (91.1 kg)  02/01/24 190 lb (86.2 kg)  09/06/22 185 lb (83.9 kg)    Assessment and Plan:   1. CAD without angina: Mild CAD by coronary CTA in 2021. He has no chest pain. Continue ASA, Crestor and Zetia. LDL 76 in July 2024.   2. HTN: BP is well controlled. Continue Benicar.   Labs/ tests ordered today include:   Orders Placed This Encounter  Procedures   EKG 12-Lead   Disposition:   F/U with me in one year  Signed, Bruckner Verlin, MD, [facility] 04/10/2024 10:00 AM    [facility] [address] [place], [place], [place]  [address] Phone: [phone]; Fax: [phone]

## 2024-04-10 NOTE — Patient Instructions (Signed)
